# Patient Record
Sex: Female | Born: 1970 | Race: Black or African American | Hispanic: No | Smoking: Never smoker
Health system: Southern US, Community
[De-identification: ages and names within clinical notes are randomized; demographics above are authoritative.]

## PROBLEM LIST (undated history)

## (undated) DIAGNOSIS — Z5189 Encounter for other specified aftercare: Secondary | ICD-10-CM

## (undated) DIAGNOSIS — M1990 Unspecified osteoarthritis, unspecified site: Secondary | ICD-10-CM

## (undated) DIAGNOSIS — K219 Gastro-esophageal reflux disease without esophagitis: Secondary | ICD-10-CM

## (undated) DIAGNOSIS — D709 Neutropenia, unspecified: Secondary | ICD-10-CM

## (undated) DIAGNOSIS — A6000 Herpesviral infection of urogenital system, unspecified: Secondary | ICD-10-CM

## (undated) DIAGNOSIS — Z86718 Personal history of other venous thrombosis and embolism: Secondary | ICD-10-CM

## (undated) DIAGNOSIS — I10 Essential (primary) hypertension: Secondary | ICD-10-CM

## (undated) DIAGNOSIS — M797 Fibromyalgia: Secondary | ICD-10-CM

## (undated) HISTORY — PX: ABDOMINAL HYSTERECTOMY: SHX81

## (undated) HISTORY — PX: TONSILLECTOMY: SUR1361

## (undated) HISTORY — PX: TUBAL LIGATION: SHX77

---

## 2009-12-09 DIAGNOSIS — Z5189 Encounter for other specified aftercare: Secondary | ICD-10-CM

## 2009-12-09 DIAGNOSIS — IMO0001 Reserved for inherently not codable concepts without codable children: Secondary | ICD-10-CM

## 2009-12-09 HISTORY — DX: Encounter for other specified aftercare: Z51.89

## 2009-12-09 HISTORY — DX: Reserved for inherently not codable concepts without codable children: IMO0001

## 2009-12-09 HISTORY — PX: JOINT REPLACEMENT: SHX530

## 2010-04-03 ENCOUNTER — Inpatient Hospital Stay (HOSPITAL_COMMUNITY): Admission: RE | Admit: 2010-04-03 | Discharge: 2010-04-09 | Payer: Self-pay | Admitting: Orthopaedic Surgery

## 2010-04-05 ENCOUNTER — Encounter (INDEPENDENT_AMBULATORY_CARE_PROVIDER_SITE_OTHER): Payer: Self-pay | Admitting: Orthopaedic Surgery

## 2010-04-05 ENCOUNTER — Ambulatory Visit: Payer: Self-pay | Admitting: Vascular Surgery

## 2010-04-09 ENCOUNTER — Encounter (INDEPENDENT_AMBULATORY_CARE_PROVIDER_SITE_OTHER): Payer: Self-pay | Admitting: Orthopaedic Surgery

## 2010-05-16 ENCOUNTER — Ambulatory Visit: Payer: Self-pay | Admitting: Diagnostic Radiology

## 2010-05-16 ENCOUNTER — Ambulatory Visit (HOSPITAL_BASED_OUTPATIENT_CLINIC_OR_DEPARTMENT_OTHER): Admission: RE | Admit: 2010-05-16 | Discharge: 2010-05-16 | Payer: Self-pay | Admitting: Orthopaedic Surgery

## 2010-11-24 ENCOUNTER — Inpatient Hospital Stay (HOSPITAL_COMMUNITY)
Admission: EM | Admit: 2010-11-24 | Discharge: 2010-11-27 | Payer: Self-pay | Source: Home / Self Care | Attending: Internal Medicine | Admitting: Internal Medicine

## 2010-12-04 ENCOUNTER — Ambulatory Visit (HOSPITAL_COMMUNITY)
Admission: RE | Admit: 2010-12-04 | Discharge: 2010-12-04 | Payer: Self-pay | Source: Home / Self Care | Attending: Rheumatology | Admitting: Rheumatology

## 2010-12-09 HISTORY — PX: GASTRIC BYPASS: SHX52

## 2011-02-18 LAB — BASIC METABOLIC PANEL
BUN: 14 mg/dL (ref 6–23)
CO2: 26 mEq/L (ref 19–32)
Calcium: 9 mg/dL (ref 8.4–10.5)
Chloride: 107 mEq/L (ref 96–112)
Creatinine, Ser: 0.86 mg/dL (ref 0.4–1.2)
GFR calc Af Amer: >60 mL/min (ref >60)
GFR calc non Af Amer: >60 mL/min (ref >60)
Glucose, Bld: 95 mg/dL (ref 70–99)
Potassium: 3.2 mEq/L — ABNORMAL LOW (ref 3.5–5.1)
Sodium: 141 mEq/L (ref 135–145)

## 2011-02-18 LAB — CBC
HCT: 33 % — ABNORMAL LOW (ref 36.0–46.0)
HCT: 33.1 % — ABNORMAL LOW (ref 36.0–46.0)
HCT: 34.8 % — ABNORMAL LOW (ref 36.0–46.0)
Hemoglobin: 10.7 g/dL — ABNORMAL LOW (ref 12.0–15.0)
Hemoglobin: 10.9 g/dL — ABNORMAL LOW (ref 12.0–15.0)
Hemoglobin: 11.2 g/dL — ABNORMAL LOW (ref 12.0–15.0)
MCH: 27.2 pg (ref 26.0–34.0)
MCHC: 32.4 g/dL (ref 30.0–36.0)
MCV: 83.3 fL (ref 78.0–100.0)
MCV: 84 fL (ref 78.0–100.0)
Platelets: 237 10*3/uL (ref 150–400)
Platelets: 243 10*3/uL (ref 150–400)
RBC: 3.93 MIL/uL (ref 3.87–5.11)
RBC: 4 MIL/uL (ref 3.87–5.11)
RBC: 4.18 MIL/uL (ref 3.87–5.11)
RDW: 13.1 % (ref 11.5–15.5)
WBC: 3.6 10*3/uL — ABNORMAL LOW (ref 4.0–10.5)
WBC: 3.8 10*3/uL — ABNORMAL LOW (ref 4.0–10.5)
WBC: 4.4 10*3/uL (ref 4.0–10.5)

## 2011-02-18 LAB — COMPREHENSIVE METABOLIC PANEL
ALT: 38 U/L — ABNORMAL HIGH (ref 0–35)
ALT: 40 U/L — ABNORMAL HIGH (ref 0–35)
AST: 123 U/L — ABNORMAL HIGH (ref 0–37)
AST: 54 U/L — ABNORMAL HIGH (ref 0–37)
Albumin: 3.2 g/dL — ABNORMAL LOW (ref 3.5–5.2)
Alkaline Phosphatase: 64 U/L (ref 39–117)
BUN: 8 mg/dL (ref 6–23)
CO2: 28 mEq/L (ref 19–32)
Calcium: 8 mg/dL — ABNORMAL LOW (ref 8.4–10.5)
Chloride: 105 mEq/L (ref 96–112)
Chloride: 105 mEq/L (ref 96–112)
Chloride: 109 mEq/L (ref 96–112)
GFR calc Af Amer: >60 mL/min (ref >60)
GFR calc non Af Amer: >60 mL/min (ref >60)
Glucose, Bld: 102 mg/dL — ABNORMAL HIGH (ref 70–99)
Potassium: 3.7 mEq/L (ref 3.5–5.1)
Potassium: 3.9 mEq/L (ref 3.5–5.1)
Sodium: 141 mEq/L (ref 135–145)
Total Bilirubin: 0.3 mg/dL (ref 0.3–1.2)
Total Bilirubin: 0.5 mg/dL (ref 0.3–1.2)
Total Protein: 5.5 g/dL — ABNORMAL LOW (ref 6.0–8.3)

## 2011-02-18 LAB — URINALYSIS, ROUTINE W REFLEX MICROSCOPIC
Bilirubin Urine: NEGATIVE
Glucose, UA: NEGATIVE mg/dL
Ketones, ur: NEGATIVE mg/dL
Leukocytes, UA: NEGATIVE
Nitrite: NEGATIVE
Protein, ur: NEGATIVE mg/dL
Specific Gravity, Urine: 1.029 (ref 1.005–1.030)
Urobilinogen, UA: 1 mg/dL (ref 0.0–1.0)
pH: 6.5 (ref 5.0–8.0)

## 2011-02-18 LAB — RAPID URINE DRUG SCREEN, HOSP PERFORMED
Amphetamines: NOT DETECTED
Barbiturates: NOT DETECTED
Benzodiazepines: NOT DETECTED
Cocaine: NOT DETECTED
Opiates: NOT DETECTED
Tetrahydrocannabinol: NOT DETECTED

## 2011-02-18 LAB — DIFFERENTIAL
Basophils Absolute: 0 10*3/uL (ref 0.0–0.1)
Basophils Absolute: 0 10*3/uL (ref 0.0–0.1)
Basophils Relative: 1 % (ref 0–1)
Eosinophils Absolute: 0.1 10*3/uL (ref 0.0–0.7)
Eosinophils Absolute: 0.2 10*3/uL (ref 0.0–0.7)
Eosinophils Relative: 2 % (ref 0–5)
Eosinophils Relative: 3 % (ref 0–5)
Eosinophils Relative: 4 % (ref 0–5)
Lymphs Abs: 2 10*3/uL (ref 0.7–4.0)
Lymphs Abs: 2.3 10*3/uL (ref 0.7–4.0)
Monocytes Absolute: 0.2 10*3/uL (ref 0.1–1.0)
Monocytes Absolute: 0.4 10*3/uL (ref 0.1–1.0)
Monocytes Relative: 7 % (ref 3–12)
Neutro Abs: 1.1 10*3/uL — ABNORMAL LOW (ref 1.7–7.7)

## 2011-02-18 LAB — URIC ACID: Uric Acid, Serum: 4.4 mg/dL (ref 2.4–7.0)

## 2011-02-18 LAB — CARNITINE / ACYLCARNITINE PROFILE, BLD
Carnitine, Ester: 9 umol/L (ref 3.8–19.0)
Carnitine, Free: 34 umol/L (ref 25.0–55.0)
Carnitine, Total: 43 umol/L (ref 31.0–67.0)

## 2011-02-18 LAB — MAGNESIUM: Magnesium: 2.2 mg/dL (ref 1.5–2.5)

## 2011-02-18 LAB — PHOSPHORUS: Phosphorus: 3.4 mg/dL (ref 2.3–4.6)

## 2011-02-18 LAB — ALDOLASE: Aldolase: 54.8 U/L — ABNORMAL HIGH (ref <=8.1)

## 2011-02-18 LAB — MISCELLANEOUS TEST

## 2011-02-18 LAB — LACTATE DEHYDROGENASE: LDH: 502 U/L — ABNORMAL HIGH (ref 94–250)

## 2011-02-18 LAB — EXTRACTABLE NUCLEAR ANTIGEN ANTIBODY
ENA SM Ab Ser-aCnc: 1 AU/mL (ref <30)
SSA (Ro) (ENA) Antibody, IgG: 3 AU/mL (ref <30)
SSB (La) (ENA) Antibody, IgG: <1 AU/mL (ref <30)
Scleroderma (Scl-70) (ENA) Antibody, IgG: 2 AU/mL (ref <30)
Sm/rnp: 1 AU/mL (ref <30)
ds DNA Ab: 3 IU/mL (ref <30)

## 2011-02-18 LAB — ANA: Anti Nuclear Antibody(ANA): NEGATIVE

## 2011-02-18 LAB — URINE MICROSCOPIC-ADD ON

## 2011-02-18 LAB — TSH: TSH: 1.966 u[IU]/mL (ref 0.350–4.500)

## 2011-02-18 LAB — HIV ANTIBODY (ROUTINE TESTING W REFLEX): HIV: NONREACTIVE

## 2011-02-18 LAB — PROTIME-INR: Prothrombin Time: 12.2 seconds (ref 11.6–15.2)

## 2011-02-18 LAB — C-REACTIVE PROTEIN: CRP: 0.1 mg/dL — ABNORMAL LOW (ref <0.6)

## 2011-02-18 LAB — CARDIAC PANEL(CRET KIN+CKTOT+MB+TROPI): Total CK: 5939 U/L — ABNORMAL HIGH (ref 7–177)

## 2011-02-18 LAB — HAPTOGLOBIN: Haptoglobin: 61 mg/dL (ref 16–200)

## 2011-02-18 LAB — LIPID PANEL
Total CHOL/HDL Ratio: 6.4 RATIO
VLDL: 43 mg/dL — ABNORMAL HIGH (ref 0–40)

## 2011-02-18 LAB — AMMONIA
Ammonia: 29 umol/L (ref 11–35)
Ammonia: 73 umol/L — ABNORMAL HIGH (ref 11–35)

## 2011-02-18 LAB — CK
Total CK: 11782 U/L — ABNORMAL HIGH (ref 7–177)
Total CK: 15893 U/L — ABNORMAL HIGH (ref 7–177)

## 2011-02-18 LAB — JO-1 ANTIBODY-IGG: Jo-1 Antibody, IgG: 1 AU/mL (ref <30)

## 2011-02-18 LAB — HEPATITIS PANEL, ACUTE: Hep B C IgM: NEGATIVE

## 2011-02-18 LAB — LACTIC ACID, PLASMA: Lactic Acid, Venous: 1.2 mmol/L (ref 0.5–2.2)

## 2011-02-18 NOTE — Consult Note (Signed)
[name], [name]     ACCOUNT NO.:  [id number]  MEDICAL RECORD NO.:  [id number]          PATIENT TYPE:  INP  LOCATION:  4733                         FACILITY:  [facility]  PHYSICIAN:  Kathryne Hitch, MD   DATE OF BIRTH:  1971-03-06  DATE OF CONSULTATION:  11/26/2010 DATE OF DISCHARGE:                                CONSULTATION   REASON FOR CONSULTATION:  Elevated CK.  Alexandria [name] is a 40 year old female who was seen in consultation at the [facility] for evaluation of elevated CK.  I have known [name] from her visit to my office 2 days prior to her admission.  She was seen in consultation for request of Dr. Cleophas Dunker at that time.  She had a history of low back pain for 4-5 years for which she had been seeing a [facility].  She states Dr. Cleophas Dunker had MRI of the lumbar spine and diagnosis of her degenerative disk disease and referred her for epidural injections and she had some improvement after that.  She has also had problems with bilateral knee joint pain for several years and she was diagnosed with osteoarthritis of her knee joints for which she underwent right total knee replacement in April 2011, and recovered well.  Although, the pain in her bilateral knee joints persist to some extent, she also developed DVT in the postop period and was treated with Coumadin for sometime.  On the day of evaluation on November 23, 2010, we ordered some lab work and when the lab results came back, I was notified from the __________ lab that her CK was 7740.  At that time, [name] was shopping and had no complaints.  I advised her to go to emergency room for evaluation.  She states that she has been feeling somewhat tired, but not much.  She did have a very aggressive workout about 60 minutes 5 days a week.  She has done lots of abdominal crunches and has some abdominal discomfort from doing crunches.  Her muscle feel firm but still somewhat  sore but not weak.  She denies any muscular weakness.  She has no difficulty getting up from chair or sitting position.  She does have some discomfort in her knee joints which limits her to some extent.  She denies any history of hematuria.  She states she has taken medications for depression in the past, but not recently.  PAST MEDICAL HISTORY:  Remarkable for osteoarthritis of her knee joints, depression, asthma, right total knee replacement, hysterectomy, gastroesophageal reflux.  FAMILY HISTORY:  Positive for osteoarthritis, hypertension, fibromyalgia, diabetes, and asthma.  Prior to her admission, she was taking the medications which she does not recall exact name.  She believes she is on triamterene, hydrochlorothiazide, some other blood pressure medication, Nexium, and muscle relaxer.  MEDICATION ALLERGIES:  She states she cannot tolerate HYDROCODONE very well.  SOCIAL HISTORY:  She is a nonsmoker, she does not drink alcohol, occasional coffee, exercises quite aggressively.  She is gravida 3, para 2, miscarriages 1.  Pap smear 2010 was negative.  REVIEW OF SYSTEMS:  Positive for renal phenomenon, fatigue, nausea, reflux, constipation, muscle spasm, depression, and anemia.  PHYSICAL EXAMINATION:  GENERAL:  The patient was in no distress in the hospital room, well developed, well nourished. VITAL SIGNS:  Temperature 97.8, oxygen saturation 100% on room air, blood pressure 109/70, heart rate 68, respiratory rate 20. HEENT:  There was no evidence of conjunctival injection, oral ulcers, nasal ulcers, or lymphadenopathy. LUNGS:  Clear to auscultation without any added sounds. CARDIOVASCULAR SYSTEM:  Regular rate and rhythm without murmur, gallop, or rub.  She had good 4+ pulses. ABDOMEN:  Soft.  She has some muscular tenderness in the rectus abdominis muscle from crunches.  No tenderness, no mass. SKIN:  Without any rash.  She does have a surgical scar on her right knee from  the total knee replacement. MUSCULOSKELETAL:  She has some discomfort with range of motion of bilateral knee joints, warmth over her right knee joint, but no effusion was noted.  Left knee joint has some crepitus, all other joints with full range of motion.  She did have some discomfort with range of motion of her lumbar spine secondary to degenerative disk disease.  She has some prominence of the DIP and PIP joints in her feet. NEUROLOGIC:  Nonfocal.  She had good 5+ strength in all her extremities. Her DTRs were intact.  She had mild hyperalgesia and fibromyalgia tender points, only 8/15 positive.  INVESTIGATIONS:  We did some lab work through our office on November 23, 2010, which showed LFTs being elevated with AST of 132 and ALT of 36. WBC count was 3.9, which according to the patient has been baseline for her.  Her lupus anticoagulant is level.  TSH, rheumatoid factor, ANA were negative.  Serum protein electrophoresis was pending at this time. UA was negative.  Beta-2 anticardiolipin antibody and anti-CCP antibodies are pending at this point.  Vitamin D was low at 19.  Her CK came back high at 17740.  She also had some lab work in the hospital which showed ammonia level of 53, uric acid 4.4, magnesium was normal, LDH was elevated at 502.  Urine drug screen was negative.  Comprehensive metabolic panel showed elevation of AST and ALT at 87 and 41 respectively.  Her albumin was low at 3.2.  C. diff showed white cell count of 3.6, hemoglobin of 11.2.  PT was normal.  TSH was normal and CK in the hospital was 04540, lactic acid was normal.  The labs which are pending at this time include myoglobin, hepatitis panel, ENA, aldolase, carnitine level, and myositis panel.  IMPRESSION AND RECOMMENDATION:  Elevated CK, she is very active at the gym and does aggressive workout.  She has no muscular weakness on examination.  All her autoimmune workup has been negative so far.  All the myositis  panel is still pending.  She does have vitamin D deficiency, but it would not explain the elevated CK.  At this time, the differential include rhabdomyolysis, myopathy, or myositis.  We could not do EMG and nerve conduction velocities in the hospital and decided to proceed with the MRI of her thigh to rule out myositis which will be scheduled.  I also have suggested getting sedimentation rate, C-reactive protein, and  myositis panel to complete the workup.  She also has hepatitis panel pending at this point.  I believe her elevated LFTs are secondary to muscle sores as her CK is elevated.  Her TSH is normal.  If the MRI is negative, I would recommend going with the muscle biopsy and possible evaluation by neurologist for looking for any underlying pathology for myopathy.  She does have osteoarthritis of her knee and had right total knee replacement and have some discomfort in her left knee joint.  She also has degenerative disk disease of lumbar spine for which she has had MRI and epidural injections which has been quite stable.  At this point, I would wait for the results of MRI before make any further decisions.  We will follow her as needed.          ______________________________ Kathryne Hitch, MD     SD/MEDQ  D:  11/26/2010  T:  11/27/2010  Job:  [id number]  Electronically Signed by Pollyann Savoy MD on 02/18/2011 10:34:17 AM

## 2011-02-26 LAB — BASIC METABOLIC PANEL
BUN: 7 mg/dL (ref 6–23)
BUN: 4 mg/dL — ABNORMAL LOW (ref 6–23)
CO2: 28 mEq/L (ref 19–32)
CO2: 29 mEq/L (ref 19–32)
CO2: 29 mEq/L (ref 19–32)
CO2: 30 mEq/L (ref 19–32)
Calcium: 8.1 mg/dL — ABNORMAL LOW (ref 8.4–10.5)
Calcium: 8.4 mg/dL (ref 8.4–10.5)
Chloride: 98 mEq/L (ref 96–112)
Creatinine, Ser: 0.84 mg/dL (ref 0.4–1.2)
GFR calc Af Amer: >60 mL/min (ref >60)
GFR calc Af Amer: >60 mL/min (ref >60)
GFR calc Af Amer: >60 mL/min (ref >60)
GFR calc non Af Amer: >60 mL/min (ref >60)
GFR calc non Af Amer: >60 mL/min (ref >60)
GFR calc non Af Amer: >60 mL/min (ref >60)
GFR calc non Af Amer: >60 mL/min (ref >60)
Glucose, Bld: 106 mg/dL — ABNORMAL HIGH (ref 70–99)
Glucose, Bld: 108 mg/dL — ABNORMAL HIGH (ref 70–99)
Glucose, Bld: 112 mg/dL — ABNORMAL HIGH (ref 70–99)
Glucose, Bld: 112 mg/dL — ABNORMAL HIGH (ref 70–99)
Glucose, Bld: 99 mg/dL (ref 70–99)
Potassium: 3.6 mEq/L (ref 3.5–5.1)
Potassium: 3.9 mEq/L (ref 3.5–5.1)
Potassium: 3.1 mEq/L — ABNORMAL LOW (ref 3.5–5.1)
Potassium: 3.2 mEq/L — ABNORMAL LOW (ref 3.5–5.1)
Potassium: 3.8 mEq/L (ref 3.5–5.1)
Sodium: 135 mEq/L (ref 135–145)
Sodium: 131 mEq/L — ABNORMAL LOW (ref 135–145)
Sodium: 133 mEq/L — ABNORMAL LOW (ref 135–145)
Sodium: 133 mEq/L — ABNORMAL LOW (ref 135–145)

## 2011-02-26 LAB — PROTIME-INR
INR: 1.44 (ref 0.00–1.49)
INR: 1.51 — ABNORMAL HIGH (ref 0.00–1.49)
Prothrombin Time: 13.2 seconds (ref 11.6–15.2)
Prothrombin Time: 17.4 seconds — ABNORMAL HIGH (ref 11.6–15.2)

## 2011-02-26 LAB — ABO/RH: ABO/RH(D): O POS

## 2011-02-26 LAB — IRON AND TIBC
Iron: 27 ug/dL — ABNORMAL LOW (ref 42–135)
TIBC: 198 ug/dL — ABNORMAL LOW (ref 250–470)
UIBC: 171 ug/dL

## 2011-02-26 LAB — URINALYSIS, ROUTINE W REFLEX MICROSCOPIC
Bilirubin Urine: NEGATIVE
Ketones, ur: NEGATIVE mg/dL
Ketones, ur: NEGATIVE mg/dL
Leukocytes, UA: NEGATIVE
Nitrite: NEGATIVE
Nitrite: NEGATIVE
Protein, ur: NEGATIVE mg/dL
Specific Gravity, Urine: 1.031 — ABNORMAL HIGH (ref 1.005–1.030)
Urobilinogen, UA: 1 mg/dL (ref 0.0–1.0)
pH: 6 (ref 5.0–8.0)
pH: 6.5 (ref 5.0–8.0)
pH: 7 (ref 5.0–8.0)

## 2011-02-26 LAB — URINE CULTURE
Colony Count: NO GROWTH
Culture: NO GROWTH
Culture: NO GROWTH

## 2011-02-26 LAB — OSMOLALITY: Osmolality: 266 mOsm/kg — ABNORMAL LOW (ref 275–300)

## 2011-02-26 LAB — CBC
HCT: 25.3 % — ABNORMAL LOW (ref 36.0–46.0)
HCT: 27.7 % — ABNORMAL LOW (ref 36.0–46.0)
HCT: 24.8 % — ABNORMAL LOW (ref 36.0–46.0)
HCT: 29.4 % — ABNORMAL LOW (ref 36.0–46.0)
HCT: 36.7 % (ref 36.0–46.0)
Hemoglobin: 9.7 g/dL — ABNORMAL LOW (ref 12.0–15.0)
Hemoglobin: 8.4 g/dL — ABNORMAL LOW (ref 12.0–15.0)
Hemoglobin: 9.5 g/dL — ABNORMAL LOW (ref 12.0–15.0)
MCHC: 34 g/dL (ref 30.0–36.0)
MCHC: 35.2 g/dL (ref 30.0–36.0)
MCHC: 34.8 g/dL (ref 30.0–36.0)
MCHC: 34.8 g/dL (ref 30.0–36.0)
MCV: 83.6 fL (ref 78.0–100.0)
MCV: 82.6 fL (ref 78.0–100.0)
MCV: 82.7 fL (ref 78.0–100.0)
Platelets: 264 10*3/uL (ref 150–400)
Platelets: 203 10*3/uL (ref 150–400)
Platelets: 258 10*3/uL (ref 150–400)
RBC: 2.99 MIL/uL — ABNORMAL LOW (ref 3.87–5.11)
RBC: 3.56 MIL/uL — ABNORMAL LOW (ref 3.87–5.11)
RDW: 14.6 % (ref 11.5–15.5)
RDW: 14.7 % (ref 11.5–15.5)
RDW: 13.6 % (ref 11.5–15.5)
RDW: 13.8 % (ref 11.5–15.5)
RDW: 14.1 % (ref 11.5–15.5)
WBC: 5.7 10*3/uL (ref 4.0–10.5)

## 2011-02-26 LAB — COMPREHENSIVE METABOLIC PANEL
Albumin: 4 g/dL (ref 3.5–5.2)
BUN: 11 mg/dL (ref 6–23)
Chloride: 98 mEq/L (ref 96–112)
Creatinine, Ser: 0.86 mg/dL (ref 0.4–1.2)
Total Bilirubin: 0.2 mg/dL — ABNORMAL LOW (ref 0.3–1.2)
Total Protein: 6.7 g/dL (ref 6.0–8.3)

## 2011-02-26 LAB — DIFFERENTIAL
Basophils Absolute: 0 10*3/uL (ref 0.0–0.1)
Lymphocytes Relative: 47 % — ABNORMAL HIGH (ref 12–46)
Monocytes Absolute: 0.4 10*3/uL (ref 0.1–1.0)
Neutro Abs: 2.1 10*3/uL (ref 1.7–7.7)

## 2011-02-26 LAB — CROSSMATCH: ABO/RH(D): O POS

## 2011-02-26 LAB — TYPE AND SCREEN
ABO/RH(D): O POS
Antibody Screen: NEGATIVE

## 2011-02-26 LAB — URINE MICROSCOPIC-ADD ON

## 2011-02-26 LAB — APTT: aPTT: 27 seconds (ref 24–37)

## 2011-02-26 LAB — VITAMIN B12: Vitamin B-12: 1039 pg/mL — ABNORMAL HIGH (ref 211–911)

## 2012-03-23 ENCOUNTER — Encounter (HOSPITAL_COMMUNITY): Payer: Self-pay | Admitting: Pharmacy Technician

## 2012-03-30 ENCOUNTER — Encounter (HOSPITAL_COMMUNITY)
Admission: RE | Admit: 2012-03-30 | Discharge: 2012-03-30 | Disposition: A | Discharge status: Home / Self Care | Payer: BC Managed Care – PPO | Source: Ambulatory Visit | Attending: Orthopedic Surgery | Admitting: Orthopedic Surgery

## 2012-03-30 ENCOUNTER — Encounter (HOSPITAL_COMMUNITY): Payer: Self-pay

## 2012-03-30 ENCOUNTER — Encounter (HOSPITAL_COMMUNITY)
Admission: RE | Admit: 2012-03-30 | Discharge: 2012-03-30 | Disposition: A | Discharge status: Home / Self Care | Payer: BC Managed Care – PPO | Source: Ambulatory Visit | Attending: Orthopaedic Surgery | Admitting: Orthopaedic Surgery

## 2012-03-30 HISTORY — DX: Unspecified osteoarthritis, unspecified site: M19.90

## 2012-03-30 HISTORY — DX: Personal history of other venous thrombosis and embolism: Z86.718

## 2012-03-30 HISTORY — DX: Fibromyalgia: M79.7

## 2012-03-30 HISTORY — DX: Gastro-esophageal reflux disease without esophagitis: K21.9

## 2012-03-30 HISTORY — DX: Encounter for other specified aftercare: Z51.89

## 2012-03-30 HISTORY — DX: Essential (primary) hypertension: I10

## 2012-03-30 LAB — URINALYSIS, ROUTINE W REFLEX MICROSCOPIC
Nitrite: NEGATIVE
Protein, ur: NEGATIVE mg/dL
Specific Gravity, Urine: 1.02 (ref 1.005–1.030)
Urobilinogen, UA: 0.2 mg/dL (ref 0.0–1.0)

## 2012-03-30 LAB — DIFFERENTIAL
Eosinophils Relative: 3 % (ref 0–5)
Lymphocytes Relative: 56 % — ABNORMAL HIGH (ref 12–46)
Lymphs Abs: 2 10*3/uL (ref 0.7–4.0)
Monocytes Absolute: 0.4 10*3/uL (ref 0.1–1.0)
Monocytes Relative: 10 % (ref 3–12)

## 2012-03-30 LAB — SURGICAL PCR SCREEN
MRSA, PCR: NEGATIVE
Staphylococcus aureus: NEGATIVE

## 2012-03-30 LAB — CBC
MCH: 26.5 pg (ref 26.0–34.0)
Platelets: 322 10*3/uL (ref 150–400)
RBC: 4.75 MIL/uL (ref 3.87–5.11)
RDW: 12.6 % (ref 11.5–15.5)
WBC: 3.6 10*3/uL — ABNORMAL LOW (ref 4.0–10.5)

## 2012-03-30 LAB — COMPREHENSIVE METABOLIC PANEL
ALT: 39 U/L — ABNORMAL HIGH (ref 0–35)
AST: 25 U/L (ref 0–37)
Albumin: 4.2 g/dL (ref 3.5–5.2)
CO2: 32 mEq/L (ref 19–32)
Calcium: 10.2 mg/dL (ref 8.4–10.5)
GFR calc non Af Amer: 85 mL/min — ABNORMAL LOW (ref >90)
Sodium: 138 mEq/L (ref 135–145)

## 2012-03-30 LAB — TYPE AND SCREEN
ABO/RH(D): O POS
Antibody Screen: NEGATIVE

## 2012-03-30 MED ORDER — CHLORHEXIDINE GLUCONATE 4 % EX LIQD: 60.0000 mL | Freq: Every day | CUTANEOUS | Status: DC

## 2012-03-30 NOTE — Pre-Procedure Instructions (Signed)
20 Alexandria [name]  03/30/2012   Your procedure is scheduled on:  04/07/2012 Tuesday .   Report to [facility] at 0800 AM.per Dr Cleophas Dunker.   Call this number if you have problems the morning of surgery: [phone]   Remember:   Do not eat food:After Midnight.  May have clear liquids: up to 4 Hours before arrival is  0400 am. Do not drink any liquids after 0400 am the day of surgery .    Clear liquids include soda, tea, black coffee, apple or grape juice, broth.  Take these medicines the morning of surgery with A SIP OF WATER: norvasc  nexium  tramadol   Do not wear jewelry, make-up or nail polish.  Do not wear lotions, powders, or perfumes. You may wear deodorant.  Do not shave 48 hours prior to surgery.  Do not bring valuables to the hospital.  Contacts, dentures or bridgework may not be worn into surgery.  Leave suitcase in the car. After surgery it may be brought to your room.  For patients admitted to the hospital, checkout time is 11:00 AM the day of discharge.   Patients discharged the day of surgery will not be allowed to drive home.  Name and phone number of your driver: mother Silvio Pate- [phone]  Special Instructions: CHG Shower Use Special Wash: 1/2 bottle night before surgery and 1/2 bottle morning of surgery.   Please read over the following fact sheets that you were given: Pain Booklet, Coughing and Deep Breathing, Blood Transfusion Information, MRSA Information and Surgical Site Infection Prevention

## 2012-03-31 NOTE — Consult Note (Addendum)
Anesthesia Chart Review:  Patient is a 41 year old female scheduled for a left TKR on 04/07/12.  History includes HTN, right TKA on 04/03/10 complicated by right LE DVT, hospitalization for rhabdomyolysis of unclear etiology in December 2011, GERD s/p Nissen fundoplication, asthma, fibromyalgia, arthritis, blood transfusion, obesity with BMI 35, history of gastric bypass and hysterectomy.  Her last PCP listed is Dr. Darnell Level in [facility].  CXR from 03/30/12 showed no acute process.  Labs noted.  AST 25, ALT minimally elevated at 39.  WBC 3.6.  Coags WNL.  EKG from 03/30/12 showed NSR, minimal voltage criteria for LVH, anterior infarct (age undetermined), anteroseptal T wave inversion (consider ischemia), more pronounced than from her last EKG on 11/24/10 and appear new in V4 and V5 since 03/29/10.  Prior to her right TKA she reported a history of a stress test around 2010 which was thought to be at [facility].  At that time (and I confirmed again today), [facility] did not have a record of a stress test, echo, or EKG.  Today I called Dr. Evangeline Gula office and they do not have one on file either--however, I was told only the last two years are currently kept on location and her other records are in storage and not readily available.  I was unable to reach the patient by telephone earlier today.    I reviewed above with Anesthesiologist Dr. Chaney Malling.  He recommends Cardiology evaluation pre-operatively unless we are able to find out that the patient has had a recent cardiac evaluation/diagnostic study that was WNL with similar EKG findings.  I updated Alexandria [name] at Dr. Hoy Register office.  She will review with Dr. Cleophas Dunker or his PA.  Addendum:  04/01/12 1600  I called and spoke with the patient.  She thought she may have had some cardiac testing done in the past, but if so it was at [facility] (which they have no record of).  She tolerated a gastric bypass procedure at [facility] in October 2012.  She denies  CP, SOB.  She is no very active due to knee pain, but says she could walk up to a mile if needed.  She gets occasional knee swelling, but otherwise no significant edema.  Dr. Hoy Register office did provide notes of medical clearance from her PCP.  They also touched base with the Bariatric surgery staff who felt there was no restrictions from their standpoint.  I reviewed the new information provided with Anesthesiologist Dr. Noreene Larsson.  He agrees that it would still be best for her to have a Cardiology evaluation pre-operatively due to her abnormal EKG.  I notified Alexandria [name] at Dr. Hoy Register office.  They have arranged for her to see Cardiologist Dr. Donnie Aho on Friday, 04/03/12.    Addendum:  04/03/12 1520  Patient was seen by Dr. Donnie Aho earlier today.  He felt she was acceptable to proceed with the planned procedure.  He did recommend post-op DVT prophylaxis with her history of post-operative DVT.  He felt her abnormal EKG was at baseline, and since she had tolerated recent surgery he only recommended an echocardiogram post-operatively ("when she is recovering").  Alexandria Chock, PA-C

## 2012-04-01 NOTE — H&P (Signed)
CHIEF COMPLAINT: Painful left knee.    HISTORY: Alexandria [name] is a very pleasant 41 year old African American female who is seen today for evaluation of her left knee. She is status post right total knee arthroplasty with good results. However, her left knee is starting to become very symptomatic. She is now having this constant extremely severe aching pain with marked weakness secondary to the pain. It is getting worse and it really even wakes her at nighttime. Everything makes her symptoms worse. Ice and elevation have been helpful. She is using antispasmodics and Cymbalta and has had multiple injections, bracing, and physical therapy, but unfortunately she continues to have worsening pain to the point where she is having difficulty with doing her activities of daily living and actually just being able to ambulate at all. She is seen today for evaluation.   PAST MEDICAL HISTORY: Surgeries have included that of bariatric surgery in October of 2012. Right total knee arthroplasty in April 2011. In 2008 a total abdominal hysterectomy with bilateral salpingo-oophorectomy. In 2006 outpatient arthroscopy of both knees. She has had childbirth in November of 1993 and October of 1990. She has had also 3 nerve blocks as an outpatient.   CURRENT MEDICATIONS:  Tramadol p.r.n. Voltaren p.r.n. Cymbalta 60 mg daily Multivitamin daily Vitamin D daily Amlodipine daily Methocarbamol 500 p.r.n. Nexium 60 mg daily    allergies: None known.    REVIEW OF SYSTEMS: A 14 point review of systems is negative except for asthma and weight loss secondary to sleeve bypass. She has had a DVT in the past to my recollection.   FAMILY HISTORY: Family history reveals a mother who is still alive at age 86 who has hypertension. Father is alive at age 38 also. Brother is age 10. No sisters.   SOCIAL HISTORY: She is a 37 year old African American female. Divorced. Administrator. Denies use of tobacco or alcohol.   PHYSICAL EXAM:  Examination today reveals a very pleasant 41 year old [place] American female. Well-developed, well-nourished, alert, pleasant and cooperative in moderate distress secondary to left knee pain. She is 5 foot 6 inches and weighs 221 pounds. BMI is 35.7. Temperature 98.4, pulse 72, respirations 18, blood pressure 138/101.    Head is normocephalic. Eyes: pupils equal and round and react to light and accommodation. Extraocular motions are intact.  Ears, nose and throat were benign. Neck was supple; no bruits. Chest had good expansion. Lungs were clear to auscultation. Cardiac had a regular rhythm and rate, normal S1-S2. No murmurs noted.  Abdomen is obese, soft, nontender. No masses palpable. Normal bowel sounds present. Genital, rectal and breast exam not indicated for orthopedic evaluation. CNS oriented x3 and cranial nerves II through XII grossly intact. Her skin is intact. Musculoskeletal: today the left knee reveals range of motion from 2 degrees to 105 degrees. She does have a positive effusion. There is crepitance with range of motion of the left knee. Diffuse tenderness about the knee. Pseudo-laxity with varus and valgus stressing. Neurovascularly intact distally.   CLINICAL IMPRESSION:  1.   End-stage OA left knee. 2.   Status post right total knee arthroplasty.  3.   Obesity. BMI of 35.7.  4.   History of hypertension. 5.   History of asthma. 6.   History of GERD. 7.   History of DVT.    recommendations: At this time we feel that she is a candidate for a total knee replacement on the left side. Certainly with her history as well as her exam she has  failed numerous treatments. Radiographically she is noted to be bone-on-bone medial compartment OA with periarticular spurring more medially than lateral, and also has some patellofemoral arthritis noted.   The procedure risks and benefits have been explained to her again and she is understanding. She would like to proceed with this in the  near future. I have reviewed the clearance information from [facility] as well as bariatric surgery.   [name], PA-C 04/01/2012 4:50 PM

## 2012-04-06 MED ORDER — CEFAZOLIN SODIUM-DEXTROSE 2-3 GM-% IV SOLR
2.0000 g | INTRAVENOUS | Status: AC
  Administered 2012-04-07: 2 g via INTRAVENOUS
  Filled 2012-04-06: qty 50

## 2012-04-07 ENCOUNTER — Encounter (HOSPITAL_COMMUNITY): Payer: Self-pay | Admitting: Vascular Surgery

## 2012-04-07 ENCOUNTER — Ambulatory Visit (HOSPITAL_COMMUNITY): Payer: BC Managed Care – PPO | Admitting: Vascular Surgery

## 2012-04-07 ENCOUNTER — Inpatient Hospital Stay (HOSPITAL_COMMUNITY)
Admission: RE | Admit: 2012-04-07 | Discharge: 2012-04-10 | DRG: 209 | Disposition: A | Discharge status: Home Health | Payer: BC Managed Care – PPO | Source: Ambulatory Visit | Attending: Orthopaedic Surgery | Admitting: Orthopaedic Surgery

## 2012-04-07 ENCOUNTER — Encounter (HOSPITAL_COMMUNITY)
Admission: RE | Disposition: A | Discharge status: Home Health | Payer: Self-pay | Source: Ambulatory Visit | Attending: Orthopaedic Surgery

## 2012-04-07 DIAGNOSIS — Z9884 Bariatric surgery status: Secondary | ICD-10-CM

## 2012-04-07 DIAGNOSIS — R040 Epistaxis: Secondary | ICD-10-CM | POA: Diagnosis not present

## 2012-04-07 DIAGNOSIS — Z90710 Acquired absence of both cervix and uterus: Secondary | ICD-10-CM

## 2012-04-07 DIAGNOSIS — I10 Essential (primary) hypertension: Secondary | ICD-10-CM | POA: Diagnosis present

## 2012-04-07 DIAGNOSIS — Z6835 Body mass index (BMI) 35.0-35.9, adult: Secondary | ICD-10-CM

## 2012-04-07 DIAGNOSIS — E669 Obesity, unspecified: Secondary | ICD-10-CM | POA: Diagnosis present

## 2012-04-07 DIAGNOSIS — J45909 Unspecified asthma, uncomplicated: Secondary | ICD-10-CM | POA: Diagnosis present

## 2012-04-07 DIAGNOSIS — M179 Osteoarthritis of knee, unspecified: Secondary | ICD-10-CM | POA: Diagnosis present

## 2012-04-07 DIAGNOSIS — M1710 Unilateral primary osteoarthritis, unspecified knee: Principal | ICD-10-CM | POA: Diagnosis present

## 2012-04-07 DIAGNOSIS — Z96659 Presence of unspecified artificial knee joint: Secondary | ICD-10-CM

## 2012-04-07 DIAGNOSIS — Z86718 Personal history of other venous thrombosis and embolism: Secondary | ICD-10-CM

## 2012-04-07 DIAGNOSIS — IMO0001 Reserved for inherently not codable concepts without codable children: Secondary | ICD-10-CM | POA: Diagnosis present

## 2012-04-07 DIAGNOSIS — K219 Gastro-esophageal reflux disease without esophagitis: Secondary | ICD-10-CM | POA: Diagnosis present

## 2012-04-07 DIAGNOSIS — D62 Acute posthemorrhagic anemia: Secondary | ICD-10-CM | POA: Diagnosis not present

## 2012-04-07 HISTORY — PX: TOTAL KNEE ARTHROPLASTY: SHX125

## 2012-04-07 SURGERY — ARTHROPLASTY, KNEE, TOTAL
Anesthesia: Regional | Site: Knee | Laterality: Left | Wound class: Clean

## 2012-04-07 MED ORDER — METOCLOPRAMIDE HCL 5 MG/ML IJ SOLN: 5.0000 mg | Freq: Three times a day (TID) | INTRAMUSCULAR | Status: DC | PRN

## 2012-04-07 MED ORDER — LACTATED RINGERS IV SOLN
INTRAVENOUS | Status: DC
  Administered 2012-04-07: 09:00:00 via INTRAVENOUS

## 2012-04-07 MED ORDER — FENTANYL CITRATE 0.05 MG/ML IJ SOLN
INTRAMUSCULAR | Status: DC | PRN
  Administered 2012-04-07: 50 ug via INTRAVENOUS
  Administered 2012-04-07 (×3): 25 ug via INTRAVENOUS
  Administered 2012-04-07: 150 ug via INTRAVENOUS
  Administered 2012-04-07: 50 ug via INTRAVENOUS
  Administered 2012-04-07 (×3): 25 ug via INTRAVENOUS
  Administered 2012-04-07: 50 ug via INTRAVENOUS
  Administered 2012-04-07 (×2): 25 ug via INTRAVENOUS

## 2012-04-07 MED ORDER — ACETAMINOPHEN 10 MG/ML IV SOLN
1000.0000 mg | Freq: Once | INTRAVENOUS | Status: AC
  Administered 2012-04-07: 1000 mg via INTRAVENOUS
  Filled 2012-04-07: qty 100

## 2012-04-07 MED ORDER — LACTATED RINGERS IV SOLN: INTRAVENOUS | Status: DC

## 2012-04-07 MED ORDER — LIDOCAINE HCL (CARDIAC) 20 MG/ML IV SOLN
INTRAVENOUS | Status: DC | PRN
  Administered 2012-04-07: 20 mg via INTRAVENOUS

## 2012-04-07 MED ORDER — HYDROMORPHONE HCL PF 1 MG/ML IJ SOLN
0.2500 mg | INTRAMUSCULAR | Status: DC | PRN
  Administered 2012-04-07 (×3): 0.5 mg via INTRAVENOUS

## 2012-04-07 MED ORDER — MIDAZOLAM HCL 2 MG/2ML IJ SOLN: 1.0000 mg | INTRAMUSCULAR | Status: DC | PRN

## 2012-04-07 MED ORDER — DOCUSATE SODIUM 100 MG PO CAPS
100.0000 mg | ORAL_CAPSULE | Freq: Two times a day (BID) | ORAL | Status: DC
  Administered 2012-04-07 – 2012-04-10 (×6): 100 mg via ORAL
  Filled 2012-04-07 (×7): qty 1

## 2012-04-07 MED ORDER — OXYCODONE HCL 5 MG PO TABS
5.0000 mg | ORAL_TABLET | ORAL | Status: DC | PRN
  Administered 2012-04-07 – 2012-04-08 (×5): 10 mg via ORAL
  Administered 2012-04-08: 5 mg via ORAL
  Administered 2012-04-08 (×3): 10 mg via ORAL
  Administered 2012-04-08: 5 mg via ORAL
  Administered 2012-04-09 (×6): 10 mg via ORAL
  Administered 2012-04-10: 5 mg via ORAL
  Administered 2012-04-10: 10 mg via ORAL
  Filled 2012-04-07 (×16): qty 2

## 2012-04-07 MED ORDER — BUPIVACAINE-EPINEPHRINE 0.25% -1:200000 IJ SOLN
INTRAMUSCULAR | Status: DC | PRN
  Administered 2012-04-07: 30 mL

## 2012-04-07 MED ORDER — FENTANYL CITRATE 0.05 MG/ML IJ SOLN
INTRAMUSCULAR | Status: AC
  Filled 2012-04-07: qty 2

## 2012-04-07 MED ORDER — KETOROLAC TROMETHAMINE 30 MG/ML IJ SOLN
30.0000 mg | Freq: Four times a day (QID) | INTRAMUSCULAR | Status: DC
  Administered 2012-04-07: 30 mg via INTRAVENOUS

## 2012-04-07 MED ORDER — SODIUM CHLORIDE 0.9 % IR SOLN
Status: DC | PRN
  Administered 2012-04-07: 3000 mL
  Administered 2012-04-07: 1000 mL

## 2012-04-07 MED ORDER — CEFAZOLIN SODIUM-DEXTROSE 2-3 GM-% IV SOLR
2.0000 g | Freq: Four times a day (QID) | INTRAVENOUS | Status: AC
  Administered 2012-04-07 – 2012-04-08 (×3): 2 g via INTRAVENOUS
  Filled 2012-04-07 (×3): qty 50

## 2012-04-07 MED ORDER — ZOLPIDEM TARTRATE 5 MG PO TABS: 5.0000 mg | ORAL_TABLET | Freq: Every evening | ORAL | Status: DC | PRN

## 2012-04-07 MED ORDER — PANTOPRAZOLE SODIUM 40 MG PO TBEC
80.0000 mg | DELAYED_RELEASE_TABLET | Freq: Every day | ORAL | Status: DC
  Administered 2012-04-07 – 2012-04-09 (×3): 80 mg via ORAL
  Filled 2012-04-07 (×3): qty 2

## 2012-04-07 MED ORDER — AMLODIPINE BESYLATE 10 MG PO TABS
10.0000 mg | ORAL_TABLET | Freq: Every day | ORAL | Status: DC
  Administered 2012-04-07 – 2012-04-10 (×3): 10 mg via ORAL
  Filled 2012-04-07 (×4): qty 1

## 2012-04-07 MED ORDER — HYDROCHLOROTHIAZIDE 25 MG PO TABS
25.0000 mg | ORAL_TABLET | Freq: Every day | ORAL | Status: DC
  Administered 2012-04-09: 25 mg via ORAL
  Filled 2012-04-07 (×3): qty 1

## 2012-04-07 MED ORDER — MIDAZOLAM HCL 2 MG/2ML IJ SOLN
INTRAMUSCULAR | Status: AC
  Filled 2012-04-07: qty 2

## 2012-04-07 MED ORDER — SODIUM CHLORIDE 0.9 % IV SOLN: INTRAVENOUS | Status: DC

## 2012-04-07 MED ORDER — ONDANSETRON HCL 4 MG/2ML IJ SOLN
INTRAMUSCULAR | Status: DC | PRN
  Administered 2012-04-07: 4 mg via INTRAVENOUS

## 2012-04-07 MED ORDER — FENTANYL CITRATE 0.05 MG/ML IJ SOLN: 50.0000 ug | INTRAMUSCULAR | Status: DC | PRN

## 2012-04-07 MED ORDER — METOCLOPRAMIDE HCL 10 MG PO TABS: 5.0000 mg | ORAL_TABLET | Freq: Three times a day (TID) | ORAL | Status: DC | PRN

## 2012-04-07 MED ORDER — PHENOL 1.4 % MT LIQD: 1.0000 | OROMUCOSAL | Status: DC | PRN

## 2012-04-07 MED ORDER — DULOXETINE HCL 60 MG PO CPEP
60.0000 mg | ORAL_CAPSULE | Freq: Every day | ORAL | Status: DC
Start: 2012-04-07 — End: 2012-04-10
  Administered 2012-04-07 – 2012-04-10 (×4): 60 mg via ORAL
  Filled 2012-04-07 (×4): qty 1

## 2012-04-07 MED ORDER — LACTATED RINGERS IV SOLN
INTRAVENOUS | Status: DC | PRN
  Administered 2012-04-07 (×2): via INTRAVENOUS

## 2012-04-07 MED ORDER — METHOCARBAMOL 100 MG/ML IJ SOLN
500.0000 mg | INTRAVENOUS | Status: AC
  Administered 2012-04-07: 500 mg via INTRAVENOUS
  Filled 2012-04-07: qty 5

## 2012-04-07 MED ORDER — VECURONIUM BROMIDE 10 MG IV SOLR
INTRAVENOUS | Status: DC | PRN
  Administered 2012-04-07: 4 mg via INTRAVENOUS

## 2012-04-07 MED ORDER — ACETAMINOPHEN 10 MG/ML IV SOLN
1000.0000 mg | Freq: Four times a day (QID) | INTRAVENOUS | Status: AC
  Administered 2012-04-07 – 2012-04-08 (×3): 1000 mg via INTRAVENOUS
  Filled 2012-04-07 (×5): qty 100

## 2012-04-07 MED ORDER — GLYCOPYRROLATE 0.2 MG/ML IJ SOLN
INTRAMUSCULAR | Status: DC | PRN
  Administered 2012-04-07 (×2): .3 mg via INTRAVENOUS

## 2012-04-07 MED ORDER — RIVAROXABAN 10 MG PO TABS
10.0000 mg | ORAL_TABLET | Freq: Every day | ORAL | Status: DC
  Administered 2012-04-07 – 2012-04-08 (×2): 10 mg via ORAL
  Filled 2012-04-07 (×4): qty 1

## 2012-04-07 MED ORDER — METHOCARBAMOL 100 MG/ML IJ SOLN
500.0000 mg | Freq: Four times a day (QID) | INTRAVENOUS | Status: DC | PRN
  Filled 2012-04-07: qty 5

## 2012-04-07 MED ORDER — METHOCARBAMOL 500 MG PO TABS
500.0000 mg | ORAL_TABLET | Freq: Four times a day (QID) | ORAL | Status: DC | PRN
  Administered 2012-04-07 – 2012-04-10 (×10): 500 mg via ORAL
  Filled 2012-04-07 (×10): qty 1

## 2012-04-07 MED ORDER — CHLORHEXIDINE GLUCONATE 4 % EX LIQD: 60.0000 mL | Freq: Once | CUTANEOUS | Status: DC

## 2012-04-07 MED ORDER — ONDANSETRON HCL 4 MG/2ML IJ SOLN
4.0000 mg | Freq: Four times a day (QID) | INTRAMUSCULAR | Status: DC | PRN
  Administered 2012-04-07: 4 mg via INTRAVENOUS
  Filled 2012-04-07: qty 2

## 2012-04-07 MED ORDER — MENTHOL 3 MG MT LOZG: 1.0000 | LOZENGE | OROMUCOSAL | Status: DC | PRN

## 2012-04-07 MED ORDER — ONDANSETRON HCL 4 MG PO TABS: 4.0000 mg | ORAL_TABLET | Freq: Four times a day (QID) | ORAL | Status: DC | PRN

## 2012-04-07 MED ORDER — DEXAMETHASONE SODIUM PHOSPHATE 4 MG/ML IJ SOLN
INTRAMUSCULAR | Status: DC | PRN
  Administered 2012-04-07: 8 mg via INTRAVENOUS

## 2012-04-07 MED ORDER — ESTRADIOL 0.025 MG/24HR TD PTWK
0.0250 mg | MEDICATED_PATCH | TRANSDERMAL | Status: DC
  Filled 2012-04-07: qty 1

## 2012-04-07 MED ORDER — MIDAZOLAM HCL 5 MG/5ML IJ SOLN
INTRAMUSCULAR | Status: DC | PRN
  Administered 2012-04-07 (×2): 1 mg via INTRAVENOUS

## 2012-04-07 MED ORDER — SODIUM CHLORIDE 0.9 % IV SOLN
75.0000 mL/h | INTRAVENOUS | Status: DC
  Administered 2012-04-07: 75 mL/h via INTRAVENOUS

## 2012-04-07 MED ORDER — NEOSTIGMINE METHYLSULFATE 1 MG/ML IJ SOLN
INTRAMUSCULAR | Status: DC | PRN
  Administered 2012-04-07 (×2): 2 mg via INTRAVENOUS

## 2012-04-07 MED ORDER — ONDANSETRON HCL 4 MG/2ML IJ SOLN: 4.0000 mg | Freq: Once | INTRAMUSCULAR | Status: DC | PRN

## 2012-04-07 MED ORDER — HYDROMORPHONE HCL PF 1 MG/ML IJ SOLN
0.5000 mg | INTRAMUSCULAR | Status: DC | PRN
  Administered 2012-04-08 – 2012-04-09 (×9): 1 mg via INTRAVENOUS
  Filled 2012-04-07 (×9): qty 1

## 2012-04-07 MED ORDER — ROCURONIUM BROMIDE 100 MG/10ML IV SOLN
INTRAVENOUS | Status: DC | PRN
  Administered 2012-04-07: 50 mg via INTRAVENOUS

## 2012-04-07 MED ORDER — PROPOFOL 10 MG/ML IV EMUL
INTRAVENOUS | Status: DC | PRN
  Administered 2012-04-07: 150 mg via INTRAVENOUS

## 2012-04-07 SURGICAL SUPPLY — 63 items
BANDAGE ESMARK 6X9 LF (GAUZE/BANDAGES/DRESSINGS) ×1 IMPLANT
BLADE SAGITTAL 25.0X1.19X90 (BLADE) ×2 IMPLANT
BNDG ESMARK 6X9 LF (GAUZE/BANDAGES/DRESSINGS) ×2
BOWL SMART MIX CTS (DISPOSABLE) ×2 IMPLANT
CEMENT HV SMART SET (Cement) ×4 IMPLANT
CLOTH BEACON ORANGE TIMEOUT ST (SAFETY) ×2 IMPLANT
COVER BACK TABLE 24X17X13 BIG (DRAPES) IMPLANT
COVER SURGICAL LIGHT HANDLE (MISCELLANEOUS) ×2 IMPLANT
CUFF TOURNIQUET SINGLE 34IN LL (TOURNIQUET CUFF) IMPLANT
CUFF TOURNIQUET SINGLE 44IN (TOURNIQUET CUFF) ×2 IMPLANT
DRAPE EXTREMITY T 121X128X90 (DRAPE) ×2 IMPLANT
DRAPE PROXIMA HALF (DRAPES) ×2 IMPLANT
DRSG ADAPTIC 3X8 NADH LF (GAUZE/BANDAGES/DRESSINGS) ×2 IMPLANT
DRSG PAD ABDOMINAL 8X10 ST (GAUZE/BANDAGES/DRESSINGS) ×2 IMPLANT
DURAPREP 26ML APPLICATOR (WOUND CARE) ×2 IMPLANT
ELECT CAUTERY BLADE 6.4 (BLADE) ×2 IMPLANT
ELECT REM PT RETURN 9FT ADLT (ELECTROSURGICAL) ×2
ELECTRODE REM PT RTRN 9FT ADLT (ELECTROSURGICAL) ×1 IMPLANT
EVACUATOR 1/8 PVC DRAIN (DRAIN) ×2 IMPLANT
FACESHIELD LNG OPTICON STERILE (SAFETY) ×6 IMPLANT
FLOSEAL 10ML (HEMOSTASIS) IMPLANT
GLOVE BIO SURGEON STRL SZ 6.5 (GLOVE) ×2 IMPLANT
GLOVE BIOGEL PI IND STRL 7.0 (GLOVE) ×3 IMPLANT
GLOVE BIOGEL PI IND STRL 8 (GLOVE) ×1 IMPLANT
GLOVE BIOGEL PI IND STRL 8.5 (GLOVE) ×1 IMPLANT
GLOVE BIOGEL PI INDICATOR 7.0 (GLOVE) ×3
GLOVE BIOGEL PI INDICATOR 8 (GLOVE) ×1
GLOVE BIOGEL PI INDICATOR 8.5 (GLOVE) ×1
GLOVE ECLIPSE 8.0 STRL XLNG CF (GLOVE) ×4 IMPLANT
GLOVE SURG ORTHO 8.5 STRL (GLOVE) ×4 IMPLANT
GLOVE SURG SS PI 6.5 STRL IVOR (GLOVE) ×2 IMPLANT
GOWN PREVENTION PLUS XLARGE (GOWN DISPOSABLE) ×8 IMPLANT
GOWN STRL NON-REIN LRG LVL3 (GOWN DISPOSABLE) ×2 IMPLANT
GOWN STRL REIN 3XL LVL4 (GOWN DISPOSABLE) ×2 IMPLANT
HANDPIECE INTERPULSE COAX TIP (DISPOSABLE) ×1
KIT BASIN OR (CUSTOM PROCEDURE TRAY) ×2 IMPLANT
KIT ROOM TURNOVER OR (KITS) ×2 IMPLANT
MANIFOLD NEPTUNE II (INSTRUMENTS) ×2 IMPLANT
MARKER SPHERE PSV REFLC THRD 5 (MARKER) IMPLANT
NEEDLE 22X1 1/2 (OR ONLY) (NEEDLE) ×2 IMPLANT
NS IRRIG 1000ML POUR BTL (IV SOLUTION) ×2 IMPLANT
PACK TOTAL JOINT (CUSTOM PROCEDURE TRAY) ×2 IMPLANT
PAD ARMBOARD 7.5X6 YLW CONV (MISCELLANEOUS) ×4 IMPLANT
PAD CAST 4YDX4 CTTN HI CHSV (CAST SUPPLIES) ×1 IMPLANT
PADDING CAST COTTON 4X4 STRL (CAST SUPPLIES) ×1
PADDING CAST COTTON 6X4 STRL (CAST SUPPLIES) ×2 IMPLANT
PIN SCHANZ 4MM 130MM (PIN) IMPLANT
SET HNDPC FAN SPRY TIP SCT (DISPOSABLE) ×1 IMPLANT
SPONGE GAUZE 4X4 12PLY (GAUZE/BANDAGES/DRESSINGS) ×2 IMPLANT
STAPLER VISISTAT 35W (STAPLE) ×2 IMPLANT
SUCTION FRAZIER TIP 10 FR DISP (SUCTIONS) ×2 IMPLANT
SUT BONE WAX W31G (SUTURE) ×2 IMPLANT
SUT ETHIBOND NAB CT1 #1 30IN (SUTURE) ×6 IMPLANT
SUT MNCRL AB 3-0 PS2 18 (SUTURE) ×2 IMPLANT
SUT VIC AB 0 CT1 27 (SUTURE) ×1
SUT VIC AB 0 CT1 27XBRD ANBCTR (SUTURE) ×1 IMPLANT
SUT VIC AB 1 CT1 27 (SUTURE) ×2
SUT VIC AB 1 CT1 27XBRD ANBCTR (SUTURE) ×2 IMPLANT
SYR CONTROL 10ML LL (SYRINGE) ×2 IMPLANT
TOWEL OR 17X24 6PK STRL BLUE (TOWEL DISPOSABLE) ×2 IMPLANT
TOWEL OR 17X26 10 PK STRL BLUE (TOWEL DISPOSABLE) ×2 IMPLANT
TRAY FOLEY CATH 14FR (SET/KITS/TRAYS/PACK) ×2 IMPLANT
WATER STERILE IRR 1000ML POUR (IV SOLUTION) ×2 IMPLANT

## 2012-04-07 NOTE — Anesthesia Preprocedure Evaluation (Addendum)
Anesthesia Evaluation  Patient identified by MRN, date of birth, ID band Patient awake    Reviewed: Allergy & Precautions, H&P , NPO status , Patient's Chart, lab work & pertinent test results, reviewed documented beta blocker date and time   Airway Mallampati: I      Dental  (+) Teeth Intact and Dental Advisory Given   Pulmonary asthma ,  breath sounds clear to auscultation        Cardiovascular hypertension, Pt. on medications Rhythm:Regular Rate:Normal     Neuro/Psych  Neuromuscular disease    GI/Hepatic GERD-  Medicated,  Endo/Other    Renal/GU      Musculoskeletal  (+) Fibromyalgia -  Abdominal (+) + obese,   Peds  Hematology   Anesthesia Other Findings   Reproductive/Obstetrics                          Anesthesia Physical Anesthesia Plan  ASA: II  Anesthesia Plan: General and Regional   Post-op Pain Management:    Induction: Intravenous  Airway Management Planned: Oral ETT  Additional Equipment:   Intra-op Plan:   Post-operative Plan: Extubation in OR  Informed Consent: I have reviewed the patients History and Physical, chart, labs and discussed the procedure including the risks, benefits and alternatives for the proposed anesthesia with the patient or authorized representative who has indicated his/her understanding and acceptance.   Dental advisory given  Plan Discussed with: Anesthesiologist  Anesthesia Plan Comments:         Anesthesia Quick Evaluation

## 2012-04-07 NOTE — Progress Notes (Signed)
Lunch releif by S. Gregson RN 

## 2012-04-07 NOTE — Transfer of Care (Signed)
Immediate Anesthesia Transfer of Care Note  Patient: Alexandria [name]  Procedure(s) Performed: Procedure(s) (LRB): TOTAL KNEE ARTHROPLASTY (Left)  Patient Location: PACU  Anesthesia Type: General  Level of Consciousness: awake and oriented  Airway & Oxygen Therapy: Patient Spontanous Breathing and Patient connected to nasal cannula oxygen  Post-op Assessment: Report given to PACU RN, Post -op Vital signs reviewed and stable and Patient moving all extremities X 4  Post vital signs: Reviewed and stable  Complications: No apparent anesthesia complications

## 2012-04-07 NOTE — Preoperative (Signed)
Beta Blockers   Reason not to administer Beta Blockers:Not Applicable. No home beta blockers 

## 2012-04-07 NOTE — Progress Notes (Signed)
Patient ID: Alexandria [name], female   DOB: 07-07-1971, 41 y.o.   MRN: [id number] There has been no change in health status since  the current H&P.I have examined the patient and discussed the surgery. No contraindications to the planned procedure exist.

## 2012-04-07 NOTE — Anesthesia Procedure Notes (Signed)
Anesthesia Regional Block:    Pre-Anesthetic Checklist: ,, timeout performed, Correct Patient, Correct Site, Correct Laterality, Correct Procedure, Correct Position, site marked, Risks and benefits discussed,  Surgical consent,  Pre-op evaluation,  At surgeon's request and post-op pain management  Laterality: Left  Prep: chloraprep       Needles:  Injection technique: Single-shot  Needle Type: Echogenic Stimulator Needle     Needle Length:cm 9 cm Needle Gauge: 22 and 22 G    Additional Needles:  Procedures: ultrasound guided  Narrative:  Start time: 04/07/2012 9:40 AM End time: 04/07/2012 9:50 AM  Performed by: Personally   Additional Notes: L. Mid-thigh Saphenous Nerve Bock:  25 cc 0.5% marcaine with 1:200 Epi injected into L. Adductor canal under US guidance. \ Kipp Brood, MD

## 2012-04-07 NOTE — Progress Notes (Signed)
Orthopedic Tech Progress Note Patient Details:  Alexandria [name] 1971/04/02 [id number]  CPM Left Knee CPM Left Knee: On Left Knee Flexion (Degrees): 50  Left Knee Extension (Degrees): 0    Jennye Moccasin 04/07/2012, 2:11 PM

## 2012-04-07 NOTE — Anesthesia Postprocedure Evaluation (Signed)
  Anesthesia Post-op Note  Patient: Alexandria [name]  Procedure(s) Performed: Procedure(s) (LRB): TOTAL KNEE ARTHROPLASTY (Left)  Patient Location: PACU  Anesthesia Type: General and GA combined with regional for post-op pain  Level of Consciousness: awake, alert  and oriented  Airway and Oxygen Therapy: Patient Spontanous Breathing and Patient connected to nasal cannula oxygen  Post-op Pain: mild  Post-op Assessment: Post-op Vital signs reviewed and Patient's Cardiovascular Status Stable  Post-op Vital Signs: stable  Complications: No apparent anesthesia complications

## 2012-04-07 NOTE — Progress Notes (Signed)
Patient ID: [name], female   DOB: [birth year], 41 y.o.   MRN: [id number] PATIENT ID:      [name]  MRN:     [id number] DOB/AGE:    October 20, 1971 / 42 y.o.       OPERATIVE REPORT    DATE OF PROCEDURE:  04/07/2012       PREOPERATIVE DIAGNOSIS:   osteoarthritis left knee-end stage                                                         Obesity BMI 34  POSTOPERATIVE DIAGNOSIS:   osteoarthritis left knee-end stage                                                                     same   PROCEDURE:  Procedure(s): TOTAL KNEE ARTHROPLASTY     SURGEON: Waller Marcussen W    ASSISTANT:   Jacqualine Code, PA-C   (Present and scrubbed throughout the case, critical for assistance with exposure, retraction, instrumentation, and closure.)          ANESTHESIA: Regional (femoral) with general     DRAINS: Penrose drain in the left knee :      TOURNIQUET TIME:  Total Tourniquet Time Documented: Thigh (Left) - 79 minutes    COMPLICATIONS:  None   CONDITION:  stable  PROCEDURE IN DETAIL: dictation [id number]   [name] 04/07/2012, 11:56 AM

## 2012-04-07 NOTE — Op Note (Signed)
Alexandria [name], [name]     ACCOUNT NO.:  [id number]  MEDICAL RECORD NO.:  [id number]  LOCATION:  5001                         FACILITY:  [facility]  PHYSICIAN:  Claude Manges. Mahmoud Blazejewski, M.D.DATE OF BIRTH:  01/11/1971  DATE OF PROCEDURE:  04/07/2012 DATE OF DISCHARGE:                              OPERATIVE REPORT   PREOPERATIVE DIAGNOSES: 1. End-stage osteoarthritis, left knee. 2. Obesity - Body Mass Index 34.  POSTOPERATIVE DIAGNOSES: 1. End-stage osteoarthritis, left knee. 2. Obesity - Body Mass Index 34.  PROCEDURE:  Left total knee replacement.  SURGEON:  Claude Manges. Cleophas Dunker, M.D.  ASSISTANT:  Arlys John D. Petrarca, PA-C.  ANESTHESIA:  Femoral nerve block with general anesthesia.  COMPLICATIONS:  None.  COMPONENTS:  DePuy LCS standard femoral component, a #4 Revision MBT Tray with a 10-mm rotating polyethylene bridging bearing and metal- backed 3-peg patella.  All were secured with polymethyl methacrylate.  PROCEDURE:  Mrs. Alexandria [name] was met in the [employer], identified the left knee as the appropriate operative site.  Anesthesia performed a femoral nerve block.  The patient was then transported to room #5, where she was placed under general anesthesia without difficulty.  Nursing staff inserted a Foley catheter.  Urine was clear.  Tourniquet was applied to the left thigh.  The leg was then prepped with Betadine scrub and DuraPrep from the tourniquet to the midfoot.  Sterile draping was performed.  With the extremity still elevated, was Esmarch exsanguinated with a proximal tourniquet at 350 mmHg.  A midline longitudinal incision was made, centered about the patella, extending from superior pouch to tibial tubercle via sharp dissection. Incision carried down to subcutaneous tissue.  Small bleeders were Bovie coagulated.  The first layer of capsule was incised in the midline.  A medial parapatellar incision was made with the Bovie.  The joint was entered.  There  was a clear-yellow joint effusion.  The patella was everted at 180 degrees.  The knee was flexed to 90 degrees.  There was a moderate amount of beefy-red synovitis. Synovectomy was performed.  There were large osteophytes along the medial and lateral femoral condyles.  There was almost complete absence of articular cartilage in the medial femoral condyle, probably 50-60% loss of articular cartilage in the lateral femoral condyle with some osteophytes along the medial tibial plateau.  Those osteophytes were removed.  We did template a standard femoral component.  First bony cut was made transversely with a 2-degree angle of declination on the tibia to accept the Revision MBT Tray based on the patient's weight.  At each bony cut, we checked our alignment and felt it was ideal.  Subsequent cuts were made on the femur with a 4-degree distal femoral valgus cut.  Lamina spreaders were inserted into the medial and lateral compartments to remove medial and lateral menisci, ACL, and PCL. Osteophytes removed from the medial and lateral posterior femoral condyles using the curved 3/4 inch osteotome.  MCL and LCL remained intact.  We then checked flexion and extension gaps, both of which were symmetrical at 10 mm.  The final angle cuts were made on the femur using of the finishing guide.  Retractors then placed around the tibia were advanced anteriorly.  We measured a #4 Revision tibial tray.  A center hole was made followed by the keel cut.  With the revision trial jig and the tibial component in place, we then trialed a 10-mm bridging bearing and through a full range of motion, we had full extension, no opening with varus or valgus stress, and negative anterior drawer sign.  Patella was prepared by removing 10 mm of bone leaving 14 mm of patellar thickness.  A 3-peg patellar trial was applied, 3 holes made, the trial patella inserted and reduced, and through a full range of motion,  it remained stable.  The trial components were removed.  The joint was irrigated with saline solution.  The final components were then secured with polymethyl methacrylate.  We initially inserted the Revision MBT #4 tibial tray, followed by the 10-mm bridging bearing and the standard femoral component.  The components were impacted and extraneous methacrylate was removed from the periphery.  The patella was applied with methacrylate and the patellar clamp.  After approximately 16 minutes of methacrylate had hardened during which time, we injected 0.25% Marcaine with epinephrine into the joint surface and deep capsule.  Tourniquet was deflated at 79 minutes.  Gross bleeders were Bovie coagulated.  Hemovac was inserted.  The deep capsule was then closed with interrupted #1 Ethibond, superficial capsule with running 0 Vicryl, subcu with 2-0 Vicryl, 3-0 Monocryl, and then skin clips.  A sterile bulky dressing was applied, followed by the patient's support stocking.  Patient tolerated the procedure well without complications.     Claude Manges. Cleophas Dunker, M.D.     PWW/MEDQ  D:  04/07/2012  T:  04/07/2012  Job:  [id number]

## 2012-04-08 ENCOUNTER — Encounter (HOSPITAL_COMMUNITY): Payer: Self-pay | Admitting: Orthopaedic Surgery

## 2012-04-08 LAB — CBC
HCT: 27.9 % — ABNORMAL LOW (ref 36.0–46.0)
Hemoglobin: 9 g/dL — ABNORMAL LOW (ref 12.0–15.0)
MCV: 81.1 fL (ref 78.0–100.0)
RBC: 3.44 MIL/uL — ABNORMAL LOW (ref 3.87–5.11)
RDW: 12.9 % (ref 11.5–15.5)
WBC: 5.4 10*3/uL (ref 4.0–10.5)

## 2012-04-08 LAB — BASIC METABOLIC PANEL
BUN: 9 mg/dL (ref 6–23)
CO2: 26 mEq/L (ref 19–32)
Chloride: 97 mEq/L (ref 96–112)
Creatinine, Ser: 0.73 mg/dL (ref 0.50–1.10)
GFR calc Af Amer: >90 mL/min (ref >90)
Glucose, Bld: 137 mg/dL — ABNORMAL HIGH (ref 70–99)
Potassium: 3.7 mEq/L (ref 3.5–5.1)

## 2012-04-08 MED ORDER — ACETAMINOPHEN 10 MG/ML IV SOLN
1000.0000 mg | Freq: Four times a day (QID) | INTRAVENOUS | Status: AC
  Administered 2012-04-08 – 2012-04-09 (×4): 1000 mg via INTRAVENOUS
  Filled 2012-04-08 (×4): qty 100

## 2012-04-08 NOTE — Discharge Instructions (Signed)
Home Health to be provided by [facility] [phone] 

## 2012-04-08 NOTE — Progress Notes (Signed)
Patient ID: Alexandria [name], female   DOB: 1971/02/09, 41 y.o.   MRN: [id number] PATIENT ID: Alexandria [name]        MRN:  [id number]          DOB/AGE: [birth year] / 41 y.o.  Alexandria Campbell, MD   Jacqualine Code, PA-C [address] [place], [place], [place]  [address]                             [phone]   PROGRESS NOTE  Subjective:  negative for Chest Pain  negative for Shortness of Breath  negative for Nausea/Vomiting   mild for Calf Pain  negative for Bowel Movement   Tolerating Diet: yes         Patient reports pain as mild.    Objective: Vital signs in last 24 hours:   Patient Vitals for the past 24 hrs:  BP Temp Temp src Pulse Resp SpO2 Height [name]  04/08/12 0636 96/59 mmHg 97.9 F (36.6 C) Oral 70  14  98 % - -  04/07/12 1447 133/77 mmHg 98 F (36.7 C) Oral 67  12  100 % 5\' 6"  (1.676 m) 99.2 kg (218 lb 11.1 oz)  04/07/12 1424 130/79 mmHg - - 62  14  100 % - -  04/07/12 1418 - 97 F (36.1 C) - - - - - -  04/07/12 1415 - - - 65  15  100 % - -  04/07/12 1408 128/80 mmHg - - - - - - -  04/07/12 1400 - - - 61  16  100 % - -  04/07/12 1353 125/79 mmHg - - - - - - -  04/07/12 1345 - - - 61  17  100 % - -  04/07/12 1338 125/83 mmHg - - - - - - -  04/07/12 1330 - - - 60  17  100 % - -  04/07/12 1323 130/76 mmHg - - - - - - -  04/07/12 1315 - - - - 14  - - -  04/07/12 1308 134/86 mmHg - - 63  9  100 % - -  04/07/12 1300 - - - 63  8  100 % - -  04/07/12 1256 - - - 64  20  100 % - -  04/07/12 1253 139/90 mmHg - - - - - - -  04/07/12 1245 - - - 67  9  100 % - -  04/07/12 1238 142/82 mmHg - - - - - - -  04/07/12 1230 - 97.9 F (36.6 C) - 73  15  100 % - -  04/07/12 1223 147/84 mmHg - - - - - - -  04/07/12 1215 - - - 61  16  100 % - -  04/07/12 0918 - - - 60  - 100 % - -  04/07/12 0916 - - - 61  - 100 % - -  04/07/12 0829 118/74 mmHg 97.9 F (36.6 C) Oral 67  18  98 % - -      Intake/Output from previous day:   04/30 0701 - 05/01 0700 In: 2675  [I.V.:2675] Out: 1905 [Urine:1400; Drains:355]   Intake/Output this shift:       Intake/Output      04/30 0701 - 05/01 0700 05/01 0701 - 05/02 0700   I.V. (mL/kg) 2675 (27)    Total Intake(mL/kg) 2675 (27)    Urine (mL/kg/hr) 1400 (0.6)  Drains 355    Blood 150    Total Output 1905    Net +770            LABORATORY DATA:  Basename 04/08/12 0635  WBC 5.4  HGB 9.0*  HCT 27.9*  PLT 238    Basename 04/08/12 0635  NA 131*  K 3.7  CL 97  CO2 26  BUN 9  CREATININE 0.73  GLUCOSE 137*  CALCIUM 8.2*   Lab Results  Component Value Date   INR 0.90 03/30/2012   INR 0.89 11/25/2010   INR 1.80* 04/09/2010    Examination:  General appearance: alert, cooperative and no distress  Wound Exam: clean, dry, intact   Drainage:  80 cc from hemovac in last 8 hours-D/C this pm or in am  Motor Exam: EHL, FHL, Anterior Tibial and Posterior Tibial Intact  Sensory Exam: Superficial Peroneal, Deep Peroneal and Tibial normal  Vascular Exam: Normal  Assessment:    1 Day Post-Op  Procedure(s) (LRB): TOTAL KNEE ARTHROPLASTY (Left)  ADDITIONAL DIAGNOSIS:  Active Problems:  * No active hospital problems. *   Acute Blood Loss Anemia   Plan: Physical Therapy as ordered Partial [name] Bearing @ 50% (PWB)  DVT Prophylaxis:  Xarelto  DISCHARGE PLAN: Home  DISCHARGE NEEDS: HHPT   OOB with PT-good night      Alexandria [name] W 04/08/2012, 7:58 AM

## 2012-04-08 NOTE — Progress Notes (Signed)
CARE MANAGEMENT NOTE 04/08/2012  Patient:  Alexandria [name],Alexandria [name]   Account Number:  [id number]  Date Initiated:  04/08/2012  Documentation initiated by:  Vance Peper  Subjective/Objective Assessment:   41 yr old female s/p left total knee arthroplasty     Action/Plan:   Spoke with patient regarding Home Health needs. Preoperatively setup with [facility], no changes. rolling walker, 3in1 and CPM have been delivered to the home.Has family support at discharge.   Anticipated DC Date:  04/10/2012   Anticipated DC Plan:  HOME W HOME HEALTH SERVICES      DC Planning Services  CM consult      [facility] Choice  HOME HEALTH   Choice offered to / List presented to:  C-1 Patient        HH arranged  HH-2 PT      [facility] agency  [facility]   Status of service:  Completed, signed off   Discharge Disposition:  HOME W HOME HEALTH SERVICES

## 2012-04-08 NOTE — Progress Notes (Signed)
UR COMPLETED  

## 2012-04-08 NOTE — Progress Notes (Signed)
Agree with treatment note.  04/08/2012 Cephus Shelling, PT, DPT [phone]

## 2012-04-08 NOTE — Progress Notes (Signed)
Physical Therapy Treatment Patient Details Name: Alexandria [name] MRN: [id number] DOB: 13-Oct-1971 Today's Date: 04/08/2012 Time: 4098-1191 PT Time Calculation (min): 36 min  PT Assessment / Plan / Recommendation Comments on Treatment Session       Follow Up Recommendations  Home health PT    Equipment Recommendations  None recommended by PT    Frequency 7X/week   Plan      Precautions / Restrictions Precautions Precautions: Knee Precaution Booklet Issued: No Restrictions Weight Bearing Restrictions: Yes LLE Weight Bearing: Partial weight bearing LLE Partial Weight Bearing Percentage or Pounds: 50   Pertinent Vitals/Pain 6/10 in R knee. Communicated with RN and RN brought pain medicine.      Mobility  Bed Mobility Bed Mobility: Sit to Supine Sit to Supine: 5: Supervision Details for Bed Mobility Assistance: Cueing for hand and L LE placement.  Transfers Transfers: Sit to Stand;Stand to Sit Sit to Stand: 4: Min guard;From chair/3-in-1;With armrests;With upper extremity assist Stand to Sit: 4: Min guard;With upper extremity assist;To bed Details for Transfer Assistance: Guard for balance. Cueing for sequencing.  Ambulation/Gait Ambulation/Gait Assistance: 4: Min guard Ambulation Distance (Feet): 40 Feet Assistive device: Rolling walker Ambulation/Gait Assistance Details: Guard for balance.  Gait Pattern: Step-to pattern;Decreased step length - left;Decreased stance time - left Stairs: No Wheelchair Mobility Wheelchair Mobility: No    Exercises Total Joint Exercises Ankle Circles/Pumps: AROM;Left;10 reps;Seated Quad Sets: AROM;10 reps;Seated;Left Heel Slides: Left;AROM;10 reps;Seated   PT Goals Acute Rehab PT Goals PT Goal Formulation: With patient Time For Goal Achievement: 04/15/12 Potential to Achieve Goals: Good Pt will go Sit to Stand: with modified independence PT Goal: Sit to Stand - Progress: Goal set today Pt will go Stand to Sit: with modified  independence PT Goal: Stand to Sit - Progress: Goal set today Pt will Ambulate: >150 feet;with modified independence;with rolling walker PT Goal: Ambulate - Progress: Goal set today Pt will Go Up / Down Stairs: 6-9 stairs;with modified independence;with rolling walker PT Goal: Up/Down Stairs - Progress: Goal set today Pt will Perform Home Exercise Program: Independently PT Goal: Perform Home Exercise Program - Progress: Goal set today  Visit Information  Last PT Received On: 04/08/12 Assistance Needed: +1    Subjective Data  Subjective: "I know I have to do it." Patient Stated Goal: Go home.    Cognition  Overall Cognitive Status: Appears within functional limits for tasks assessed/performed Arousal/Alertness: Awake/alert Orientation Level: Oriented X4 / Intact Behavior During Session: [facility] for tasks performed    Balance  Balance Balance Assessed: No  End of Session PT - End of Session Equipment Utilized During Treatment: Gait belt Activity Tolerance: Patient tolerated treatment well;Patient limited by pain Patient left: in bed;in CPM;with call bell/phone within reach Nurse Communication: Mobility status;Patient requests pain meds CPM Left Knee CPM Left Knee: On Left Knee Flexion (Degrees): 55  Left Knee Extension (Degrees): 0     Oretha Ellis 04/08/2012, 12:11 PM

## 2012-04-08 NOTE — Progress Notes (Signed)
Orthopedic Tech Progress Note Patient Details:  Alexandria [name] 03-Nov-1971 [id number]  Patient ID: Alexandria [name], female   DOB: 06/18/1971, 41 y.o.   MRN: [id number]   [name] 04/08/2012, 9:40 AM Trapeze bar

## 2012-04-08 NOTE — Progress Notes (Signed)
Agree with evaluation and goals.  04/08/2012 Cephus Shelling, PT, DPT [phone]

## 2012-04-08 NOTE — Progress Notes (Signed)
Physical Therapy Treatment Patient Details Name: Alexandria [name] MRN: [id number] DOB: 1971/05/04 Today's Date: 04/08/2012 Time: 7846-9629 PT Time Calculation (min): 19 min  PT Assessment / Plan / Recommendation Comments on Treatment Session  Pt admitted s/p L TKA. Very lethargic and only able to tolerate exercises in bed. Will continue to benefit from PT to follow D/C plan home for HHPT.     Follow Up Recommendations  Home health PT    Equipment Recommendations  None recommended by PT    Frequency 7X/week   Plan Discharge plan remains appropriate;Frequency remains appropriate    Precautions / Restrictions Precautions Precautions: Knee Precaution Booklet Issued: No Restrictions Weight Bearing Restrictions: Yes LLE Weight Bearing: Partial weight bearing LLE Partial Weight Bearing Percentage or Pounds: 50   Pertinent Vitals/Pain 8/10 on L knee. Premedicated by nurse. Left with ice pack on L knee.    Mobility  Bed Mobility Bed Mobility: Not assessed Transfers Transfers: Not assessed Ambulation/Gait Stairs: No Wheelchair Mobility Wheelchair Mobility: No    Exercises Total Joint Exercises Ankle Circles/Pumps: AROM;Left;10 reps;Supine Quad Sets: AROM;10 reps;Supine;Left Short Arc QuadBarbaraann Boys;Left;10 reps;Supine Heel Slides: AROM;Left;10 reps;Supine Hip ABduction/ADduction: AROM;Left;10 reps;Supine Straight Leg Raises: AAROM;Left;10 reps;Supine   PT Goals Acute Rehab PT Goals PT Goal Formulation: With patient Time For Goal Achievement: 04/15/12 Potential to Achieve Goals: Good PT Goal: Perform Home Exercise Program - Progress: Progressing toward goal  Visit Information  Last PT Received On: 04/08/12 Assistance Needed: +1    Subjective Data  Subjective: "I'm in a lot more pain, and they gave me an IV." Patient Stated Goal: Go home.   Cognition  Overall Cognitive Status: Appears within functional limits for tasks assessed/performed Arousal/Alertness:  Lethargic Orientation Level: Oriented X4 / Intact Behavior During Session: [facility] for tasks performed    Balance  Balance Balance Assessed: No  End of Session PT - End of Session Activity Tolerance: Patient limited by pain;Patient limited by fatigue Patient left: in bed;in CPM;with call bell/phone within reach (CPM L knee 0-57) Nurse Communication: Mobility status    Oretha Ellis 04/08/2012, 2:25 PM

## 2012-04-09 LAB — CBC
HCT: 26.9 % — ABNORMAL LOW (ref 36.0–46.0)
Hemoglobin: 8.8 g/dL — ABNORMAL LOW (ref 12.0–15.0)
MCHC: 32.7 g/dL (ref 30.0–36.0)
MCV: 81.5 fL (ref 78.0–100.0)
RDW: 13.1 % (ref 11.5–15.5)

## 2012-04-09 LAB — BASIC METABOLIC PANEL
BUN: 7 mg/dL (ref 6–23)
Creatinine, Ser: 0.71 mg/dL (ref 0.50–1.10)
GFR calc non Af Amer: >90 mL/min (ref >90)
Glucose, Bld: 112 mg/dL — ABNORMAL HIGH (ref 70–99)
Potassium: 3.6 mEq/L (ref 3.5–5.1)

## 2012-04-09 MED ORDER — ESOMEPRAZOLE MAGNESIUM 40 MG PO CPDR
40.0000 mg | DELAYED_RELEASE_CAPSULE | Freq: Every day | ORAL | Status: DC
  Administered 2012-04-10: 40 mg via ORAL
  Filled 2012-04-09 (×2): qty 1

## 2012-04-09 MED ORDER — WHITE PETROLATUM GEL
Status: AC
  Administered 2012-04-09: 22:00:00
  Filled 2012-04-09: qty 5

## 2012-04-09 MED ORDER — NON FORMULARY: Freq: Every day | Status: DC

## 2012-04-09 NOTE — Progress Notes (Signed)
Chart reviewed, and spoke with pt.  No OT needs identified.  Will sign off. Jeani Hawking, OTR/L [phone]

## 2012-04-09 NOTE — Progress Notes (Signed)
Physical Therapy Treatment Patient Details Name: Alexandria [name] MRN: [id number] DOB: [birth year] Today's Date: 04/09/2012 Time: 4098-1191 PT Time Calculation (min): 29 min  PT Assessment / Plan / Recommendation Comments on Treatment Session  Pt admitted s/p L TKA and continues to progress well. Completed stair training this PM and ambulated greater distance. Pt will benefit from continued PT to follow D/C plan home for HHPT.     Follow Up Recommendations  Home health PT    Equipment Recommendations  None recommended by PT    Frequency 7X/week   Plan Discharge plan remains appropriate;Frequency remains appropriate    Precautions / Restrictions Precautions Precautions: Knee Precaution Booklet Issued: No Restrictions Weight Bearing Restrictions: Yes LLE Weight Bearing: Partial weight bearing LLE Partial Weight Bearing Percentage or Pounds: 50   Pertinent Vitals/Pain 6/10 at L knee. Nurse aware.     Mobility  Bed Mobility Bed Mobility: Sit to Supine Sit to Supine: 5: Supervision Details for Bed Mobility Assistance: Cueing for hooking R LE under L LE to lift both LE up onto the bed. Transfers Transfers: Sit to Stand;Stand to Sit (Trials x 2) Sit to Stand: 5: Supervision;With upper extremity assist;With armrests;From chair/3-in-1 Stand to Sit: 5: Supervision;With upper extremity assist;To bed;To chair/3-in-1;With armrests Details for Transfer Assistance: Cueing for L LE placement and walker placement and cueing for hooking R LE under L LE when transitioning in recliner.  Ambulation/Gait Ambulation/Gait Assistance: 5: Supervision Ambulation Distance (Feet): 110 Feet Assistive device: Rolling walker Ambulation/Gait Assistance Details: Cueing for upright posture and walker placement.  Gait Pattern: Step-to pattern;Decreased step length - left;Decreased stance time - left Stairs: Yes Stairs Assistance: 4: Min guard Stair Management Technique: Two rails;Step to  pattern;Forwards ( Trials x 2) Number of Stairs: 2  (Trials x 2) Wheelchair Mobility Wheelchair Mobility: No    Exercises     PT Goals Acute Rehab PT Goals PT Goal Formulation: With patient Time For Goal Achievement: 04/15/12 Potential to Achieve Goals: Good PT Goal: Sit to Stand - Progress: Progressing toward goal PT Goal: Stand to Sit - Progress: Progressing toward goal PT Goal: Ambulate - Progress: Progressing toward goal PT Goal: Up/Down Stairs - Progress: Progressing toward goal  Visit Information  Last PT Received On: 04/09/12 Assistance Needed: +1    Subjective Data  Subjective: "I'm feeling better." Patient Stated Goal: Go home.    Cognition  Overall Cognitive Status: Appears within functional limits for tasks assessed/performed Arousal/Alertness: Awake/alert Orientation Level: Oriented X4 / Intact Behavior During Session: [facility] for tasks performed    Balance  Balance Balance Assessed: No  End of Session PT - End of Session Equipment Utilized During Treatment: Gait belt Activity Tolerance: Patient tolerated treatment well;Patient limited by fatigue;Patient limited by pain Patient left: in bed;with call bell/phone within reach;in CPM (CPM set 0-60 degrees on L knee. ) Nurse Communication: Mobility status    Oretha Ellis 04/09/2012, 2:22 PM

## 2012-04-09 NOTE — Progress Notes (Signed)
Agree with treatment.  04/09/2012 Cephus Shelling, PT, DPT [phone]

## 2012-04-09 NOTE — Progress Notes (Signed)
Patient ID: Jahzara Slattery, female   DOB: 1971-07-25, 41 y.o.   MRN: [id number] PATIENT ID: Chyenne Sobczak        MRN:  [id number]          DOB/AGE: [birth year] / 41 y.o.  [name], MD   Jacqualine Code, PA-C [address] [place], [place], [place]  [address]                             [phone]   PROGRESS NOTE  Subjective:  negative for Chest Pain  negative for Shortness of Breath  negative for Nausea/Vomiting   negative for Calf Pain  positive for Bowel Movement   Tolerating Diet: yes         Patient reports pain as mild.    Objective: Vital signs in last 24 hours:   Patient Vitals for the past 24 hrs:  BP Temp Pulse Resp SpO2  04/08/12 2105 133/73 mmHg 98.9 F (37.2 C) 79  16  97 %      Intake/Output from previous day:   05/01 0701 - 05/02 0700 In: 720 [P.O.:720] Out: 525 [Urine:400; Drains:125]   Intake/Output this shift:       Intake/Output      05/01 0701 - 05/02 0700 05/02 0701 - 05/03 0700   P.O. 720    I.V. (mL/kg)     Total Intake(mL/kg) 720 (7.3)    Urine (mL/kg/hr) 400 (0.2)    Drains 125    Blood     Total Output 525    Net +195         Urine Occurrence 1 x 1 x      LABORATORY DATA:  Basename 04/09/12 0628 04/08/12 0635  WBC 5.8 5.4  HGB 8.8* 9.0*  HCT 26.9* 27.9*  PLT 238 238    Basename 04/09/12 0628 04/08/12 0635  NA 134* 131*  K 3.6 3.7  CL 97 97  CO2 25 26  BUN 7 9  CREATININE 0.71 0.73  GLUCOSE 112* 137*  CALCIUM 8.5 8.2*   Lab Results  Component Value Date   INR 0.90 03/30/2012   INR 0.89 11/25/2010   INR 1.80* 04/09/2010    Examination:  General appearance: alert, cooperative and no distress  Wound Exam: clean, dry, intact   Drainage:  None: wound tissue dry  Motor Exam: EHL, FHL, Anterior Tibial and Posterior Tibial Intact  Sensory Exam:  Assessment:    2 Days Post-Op  Procedure(s) (LRB): TOTAL KNEE ARTHROPLASTY (Left)  ADDITIONAL DIAGNOSIS:  Active Problems:  * No active hospital  problems. *   Acute Blood Loss Anemia   Plan: Physical Therapy as ordered Partial Weight Bearing @ 50% (PWB)  DVT Prophylaxis:  Xarelto  DISCHARGE PLAN: Home  DISCHARGE NEEDS: HHPT and Walker  Plan on D/C tomorrow, did well today       Micaila Ziemba W 04/09/2012, 5:56 PM

## 2012-04-09 NOTE — Progress Notes (Signed)
Referral received for SNF. Chart reviewed and CSW has spoken with RNCM who indicates that patient is for DC to home with Home Health and DME.  CSW to sign off. Please re-consult if CSW needs arise.  Lydiana Milley T. Delorise Hunkele, BSW  [phone]  

## 2012-04-09 NOTE — Progress Notes (Signed)
Physical Therapy Treatment Patient Details Name: Alexandria [name] MRN: [id number] DOB: [birth year] Today's Date: 04/09/2012 Time: 4098-1191 PT Time Calculation (min): 28 min  PT Assessment / Plan / Recommendation Comments on Treatment Session  Pt admitted s/p L TKA and is progressing well. ROM is increasing and increased ambulation distance this AM. Will attempt stairs this PM. Pt will benefit from continued PT to follow D/C plan home for HHPT.    Follow Up Recommendations  Home health PT    Equipment Recommendations  None recommended by PT    Frequency 7X/week   Plan Discharge plan remains appropriate;Frequency remains appropriate    Precautions / Restrictions Precautions Precautions: Knee Precaution Booklet Issued: No Restrictions Weight Bearing Restrictions: Yes LLE Weight Bearing: Partial weight bearing LLE Partial Weight Bearing Percentage or Pounds: 50   Pertinent Vitals/Pain 6/10 at L Knee. Premedicated by RN.    Mobility  Bed Mobility Bed Mobility: Supine to Sit Supine to Sit: 5: Supervision Sit to Supine: 5: Supervision Details for Bed Mobility Assistance: Cueing for hand and L LE placement.  Transfers Transfers: Sit to Stand;Stand to Sit Sit to Stand: 4: Min guard;From chair/3-in-1;With armrests;With upper extremity assist Stand to Sit: 4: Min guard;With upper extremity assist;To bed Details for Transfer Assistance: Guard for balance. Cueing for hand placement and L LE placement.  Ambulation/Gait Ambulation/Gait Assistance: 4: Min guard Ambulation Distance (Feet): 80 Feet Assistive device: Rolling walker Ambulation/Gait Assistance Details: Guard for balance. Cueing for tall posture and hand placement on walker.  Gait Pattern: Step-to pattern;Decreased step length - left;Decreased stance time - left Stairs: No Wheelchair Mobility Wheelchair Mobility: No    Exercises Total Joint Exercises Ankle Circles/Pumps: AROM;Left;10 reps;Supine Quad Sets: AROM;10  reps;Supine;Left Short Arc QuadBarbaraann Boys;Left;10 reps;Supine Heel Slides: AROM;Left;10 reps;Supine Hip ABduction/ADduction: AROM;Left;10 reps;Supine Straight Leg Raises: AAROM;Left;10 reps;Supine Knee Flexion: AROM;Supine;Left (L Knee AROM 0-66 degrees)   PT Goals Acute Rehab PT Goals PT Goal Formulation: With patient Time For Goal Achievement: 04/15/12 Potential to Achieve Goals: Good PT Goal: Sit to Stand - Progress: Progressing toward goal PT Goal: Stand to Sit - Progress: Progressing toward goal PT Goal: Ambulate - Progress: Progressing toward goal PT Goal: Perform Home Exercise Program - Progress: Progressing toward goal  Visit Information  Last PT Received On: 04/09/12 Assistance Needed: +1    Subjective Data  Subjective: "I'm feeling a little better, not as groggy." Patient Stated Goal: Go home.   Cognition  Overall Cognitive Status: Appears within functional limits for tasks assessed/performed Arousal/Alertness: Awake/alert Orientation Level: Oriented X4 / Intact Behavior During Session: [facility] for tasks performed    Balance  Balance Balance Assessed: No  End of Session PT - End of Session Equipment Utilized During Treatment: Gait belt Activity Tolerance: Patient tolerated treatment well;Patient limited by fatigue;Patient limited by pain Patient left: in bed;with call bell/phone within reach Nurse Communication: Mobility status    Oretha Ellis 04/09/2012, 12:16 PM

## 2012-04-09 NOTE — Progress Notes (Signed)
Agree with treatment.  Pt progressing.  04/09/2012 Cephus Shelling, PT, DPT [phone]

## 2012-04-09 NOTE — Progress Notes (Signed)
Referral received for SNF. Chart reviewed and CSW has spoken with RNCM who indicates that patient is for DC to home with Home Health and DME.  CSW to sign off. Please re-consult if CSW needs arise.  Arash Karstens T. Jhordan Mckibben, BSW  [phone]  

## 2012-04-10 DIAGNOSIS — Z86718 Personal history of other venous thrombosis and embolism: Secondary | ICD-10-CM

## 2012-04-10 DIAGNOSIS — J45909 Unspecified asthma, uncomplicated: Secondary | ICD-10-CM | POA: Diagnosis present

## 2012-04-10 DIAGNOSIS — K219 Gastro-esophageal reflux disease without esophagitis: Secondary | ICD-10-CM | POA: Diagnosis present

## 2012-04-10 DIAGNOSIS — IMO0001 Reserved for inherently not codable concepts without codable children: Secondary | ICD-10-CM | POA: Diagnosis present

## 2012-04-10 DIAGNOSIS — M1710 Unilateral primary osteoarthritis, unspecified knee: Secondary | ICD-10-CM | POA: Diagnosis present

## 2012-04-10 DIAGNOSIS — I10 Essential (primary) hypertension: Secondary | ICD-10-CM | POA: Diagnosis present

## 2012-04-10 LAB — BASIC METABOLIC PANEL
BUN: 4 mg/dL — ABNORMAL LOW (ref 6–23)
Calcium: 8.8 mg/dL (ref 8.4–10.5)
GFR calc Af Amer: >90 mL/min (ref >90)
GFR calc non Af Amer: >90 mL/min (ref >90)
Glucose, Bld: 101 mg/dL — ABNORMAL HIGH (ref 70–99)
Sodium: 134 mEq/L — ABNORMAL LOW (ref 135–145)

## 2012-04-10 LAB — CBC
HCT: 24.9 % — ABNORMAL LOW (ref 36.0–46.0)
Hemoglobin: 8.2 g/dL — ABNORMAL LOW (ref 12.0–15.0)
MCH: 26.7 pg (ref 26.0–34.0)
MCHC: 32.9 g/dL (ref 30.0–36.0)
RDW: 12.9 % (ref 11.5–15.5)

## 2012-04-10 MED ORDER — RIVAROXABAN 10 MG PO TABS: 10.0000 mg | ORAL_TABLET | Freq: Every day | ORAL | Status: DC

## 2012-04-10 MED ORDER — METHOCARBAMOL 500 MG PO TABS: 500.0000 mg | ORAL_TABLET | Freq: Four times a day (QID) | ORAL | Status: AC | PRN

## 2012-04-10 MED ORDER — OXYCODONE HCL 5 MG PO TABS: 5.0000 mg | ORAL_TABLET | ORAL | Status: AC | PRN

## 2012-04-10 NOTE — Progress Notes (Signed)
Physical Therapy Treatment Patient Details Name: Alexandria [name] MRN: [id number] DOB: 04-11-1971 Today's Date: 04/10/2012 Time: 4098-1191 PT Time Calculation (min): 23 min  PT Assessment / Plan / Recommendation Comments on Treatment Session  Pt admitted s/p L TKA and progressing well. Pt ready for safe D/C once medically stable by MD.     Follow Up Recommendations  Home health PT    Equipment Recommendations  None recommended by PT    Frequency 7X/week   Plan Discharge plan remains appropriate;Frequency remains appropriate    Precautions / Restrictions Precautions Precautions: Knee Precaution Booklet Issued: No Restrictions Weight Bearing Restrictions: Yes LLE Weight Bearing: Partial weight bearing LLE Partial Weight Bearing Percentage or Pounds: 50   Pertinent Vitals/Pain 6/10 at L knee. Premedicated by RN. Left with ice pack.     Mobility  Bed Mobility Bed Mobility: Sit to Supine;Supine to Sit Supine to Sit: 6: Modified independent (Device/Increase time) Sit to Supine: 6: Modified independent (Device/Increase time) Details for Bed Mobility Assistance: Pt able to hook R LE under L LE to bring to EOB with no cueing.  Transfers Transfers: Sit to Stand;Stand to Sit Sit to Stand: 6: Modified independent (Device/Increase time) Stand to Sit: 6: Modified independent (Device/Increase time) Ambulation/Gait Ambulation/Gait Assistance: 5: Supervision Ambulation Distance (Feet): 110 Feet Assistive device: Rolling walker Ambulation/Gait Assistance Details: Cueing for step through pattern of gait.  Gait Pattern: Step-through pattern;Decreased step length - left;Decreased stance time - left (Working on step-through pattern) Stairs: Yes Stairs Assistance: 5: Supervision Stairs Assistance Details (indicate cue type and reason): Cueing for "up with good and down with bad." Stair Management Technique: Two rails;Step to pattern;Forwards Number of Stairs: 2  Wheelchair  Mobility Wheelchair Mobility: No    Exercises Total Joint Exercises Ankle Circles/Pumps: AROM;Left;10 reps;Supine Quad Sets: AROM;10 reps;Supine;Left Short Arc QuadBarbaraann Boys;Left;10 reps;Supine Heel Slides: AROM;Left;10 reps;Supine Hip ABduction/ADduction: AROM;Left;10 reps;Supine Straight Leg Raises: AAROM;Left;10 reps;Supine Knee Flexion: AROM;Supine;Left (Knee AROM 0-55 degrees. )   PT Goals Acute Rehab PT Goals PT Goal Formulation: With patient Time For Goal Achievement: 04/15/12 Potential to Achieve Goals: Good PT Goal: Sit to Stand - Progress: Met PT Goal: Stand to Sit - Progress: Met PT Goal: Ambulate - Progress: Progressing toward goal PT Goal: Up/Down Stairs - Progress: Progressing toward goal PT Goal: Perform Home Exercise Program - Progress: Progressing toward goal  Visit Information  Last PT Received On: 04/10/12 Assistance Needed: +1    Subjective Data  Subjective: "I'm ready to go home."  Patient Stated Goal: Go home.    Cognition  Overall Cognitive Status: Appears within functional limits for tasks assessed/performed Arousal/Alertness: Awake/alert Orientation Level: Oriented X4 / Intact Behavior During Session: [facility] for tasks performed    Balance  Balance Balance Assessed: No  End of Session PT - End of Session Equipment Utilized During Treatment: Gait belt Activity Tolerance: Patient tolerated treatment well Patient left: in bed;with call bell/phone within reach Nurse Communication: Mobility status CPM Left Knee CPM Left Knee: Off    Oretha Ellis 04/10/2012, 12:03 PM

## 2012-04-10 NOTE — Progress Notes (Signed)
Alexandria [name] Patient called for pain medicine. Assisted patient to the bathroom. She blew her nose and it was bleeding. Very small amount of blood on tissue. Patient stated her nose was dry. Applied saline to some Q-tips and cleaned nares and applied petroleum jelly to nares as well. Patient then complained of headache and questioned taking blood pressure medicine today. Told patient that she took Norvasc 10mg  and HCTZ 25mg . Patient stated she does not take those two together. She takes either or, not both. VS WNL. No further episodes of nose bleeds. Patient wanted to hold Xarelto to prevent any more nose bleeds. Patient still c/o headache, stating it must be her allergies. Will continue to monitor.

## 2012-04-10 NOTE — Progress Notes (Signed)
Patient ID: Alexandria [name], female   DOB: February 03, 1971, 41 y.o.   MRN: [id number] PATIENT ID: Alexandria [name]        MRN:  [id number]          DOB/AGE: [birth year] / 41 y.o.  [name], MD   Jacqualine Code, PA-C [address] [place], [place], [place]  [address]                             [phone]   PROGRESS NOTE  Subjective:  negative for Chest Pain  negative for Shortness of Breath  negative for Nausea/Vomiting   negative for Calf Pain  negative for Bowel Movement   Tolerating Diet: yes         Patient reports pain as mild.    Objective: Vital signs in last 24 hours:   Patient Vitals for the past 24 hrs:  BP Temp Pulse Resp SpO2  04/10/12 0539 115/56 mmHg 98.4 F (36.9 C) 87  16  96 %      Intake/Output from previous day:       Intake/Output this shift:       Intake/Output      05/02 0701 - 05/03 0700 05/03 0701 - 05/04 0700   P.O.     Total Intake(mL/kg)     Urine (mL/kg/hr)     Drains     Total Output     Net          Urine Occurrence 1 x       LABORATORY DATA:  Basename 04/10/12 0640 04/09/12 0628 04/08/12 0635  WBC 5.7 5.8 5.4  HGB 8.2* 8.8* 9.0*  HCT 24.9* 26.9* 27.9*  PLT 217 238 238    Basename 04/10/12 0640 04/09/12 0628 04/08/12 0635  NA 134* 134* 131*  K 3.4* 3.6 3.7  CL 94* 97 97  CO2 33* 25 26  BUN 4* 7 9  CREATININE 0.74 0.71 0.73  GLUCOSE 101* 112* 137*  CALCIUM 8.8 8.5 8.2*   Lab Results  Component Value Date   INR 0.90 03/30/2012   INR 0.89 11/25/2010   INR 1.80* 04/09/2010    Examination:  General appearance: alert, cooperative and no distress  Wound Exam: clean, dry, intact   Drainage:  None: wound tissue dry  Motor Exam: EHL, FHL, Anterior Tibial and Posterior Tibial Intact  Sensory Exam: Superficial Peroneal, Deep Peroneal and Tibial normal  Vascular Exam: Normal  Assessment:    3 Days Post-Op  Procedure(s) (LRB): TOTAL KNEE ARTHROPLASTY (Left)  ADDITIONAL DIAGNOSIS:  Active Problems:  *  No active hospital problems. *   Acute Blood Loss Anemia   Plan: Physical Therapy as ordered Partial Weight Bearing @ 50% (PWB)  DVT Prophylaxis:  Xarelto  DISCHARGE PLAN: Home  DISCHARGE NEEDS: HHPT   will D/C today, great effort in PT. H&H decreased but without symptoms      Javontay Vandam W 04/10/2012, 7:48 AM

## 2012-04-10 NOTE — Discharge Summary (Signed)
Alexandria Campbell, MD   Jacqualine Code, PA-C [address] [place], [place], [place]  [address]                             [phone]  PATIENT ID: Alexandria [name]        MRN:  [id number]          DOB/AGE: 43-Sep-1972 / 43 y.o.    DISCHARGE SUMMARY  ADMISSION DATE:    04/07/2012 DISCHARGE DATE:   04/10/2012   ADMISSION DIAGNOSIS: osteoarthritis left knee    DISCHARGE DIAGNOSIS:  osteoarthritis left knee    ADDITIONAL DIAGNOSIS: Principal Problem:  *Osteoarthritis of knee Active Problems:  GERD (gastroesophageal reflux disease)  Hypertension  Asthma  Obesity, Class II, BMI 35-39.9, with comorbidity  History of DVT of lower extremity  Past Medical History  Diagnosis Date  . Hypertension   . Asthma   . Blood transfusion 2011    with right tka  . GERD (gastroesophageal reflux disease)   . Arthritis   . Fibromyalgia   . H/O blood clots 2011-2012    post op clots calf right leg.Marland Kitchen     PROCEDURE: Procedure(s): LEFT TOTAL KNEE ARTHROPLASTY on 04/07/2012  CONSULTS:   NONE  HISTORY: Alexandria [name] is a very pleasant 41 year old African American female who is seen today for evaluation of her left knee. She is status post right total knee arthroplasty with good results. However, her left knee is starting to become very symptomatic. She is now having this constant extremely severe aching pain with marked weakness secondary to the pain. It is getting worse and it really even wakes her at nighttime. Everything makes her symptoms worse. Ice and elevation have been helpful. She is using antispasmodics and Cymbalta and has had multiple injections, bracing, and physical therapy, but unfortunately she continues to have worsening pain to the point where she is having difficulty with doing her activities of daily living and actually just being able to ambulate at all.   HOSPITAL COURSE:  Alexandria [name] is a 41 y.o. admitted on 04/07/2012 and found to have a diagnosis of osteoarthritis  left knee.  After appropriate laboratory studies were obtained  they were taken to the operating room on 04/07/2012 and underwent Procedure(s): LEFT TOTAL KNEE ARTHROPLASTY.   They were given perioperative antibiotics:  Anti-infectives     Start     Dose/Rate Route Frequency Ordered Stop   04/07/12 1600   ceFAZolin (ANCEF) IVPB 2 g/50 mL premix        2 g 100 mL/hr over 30 Minutes Intravenous Every 6 hours 04/07/12 1502 04/08/12 0349   04/07/12 0600   ceFAZolin (ANCEF) IVPB 2 g/50 mL premix        2 g 100 mL/hr over 30 Minutes Intravenous 60 min pre-op 04/06/12 1338 04/07/12 0959        .  Tolerated the procedure well.  Placed with a foley intraoperatively.  Given Ofirmev at induction and for 48 hours.    POD #1, allowed out of bed to a chair.  PT for ambulation and exercise program.  Foley D/C'd in morning.  IV saline locked.  O2 discontionued.  POD #2, continued PT and ambulation.   Hemovac pulled.  POD #3,  Continued with PT.  Pain controlled . The remainder of the hospital course was dedicated to ambulation and strengthening.   The patient was discharged on 3 Days Post-Op in  Stable condition.  Blood products given:none  DIAGNOSTIC STUDIES: Recent vital signs:  Patient Vitals for the past 24 hrs:  BP Temp Pulse Resp SpO2  04/10/12 0539 115/56 mmHg 98.4 F (36.9 C) 87  16  96 %       Recent laboratory studies:  Basename 04/10/12 0640 2012-04-22 0628 04/08/12 0635  WBC 5.7 5.8 5.4  HGB 8.2* 8.8* 9.0*  HCT 24.9* 26.9* 27.9*  PLT 217 238 238    Basename 04/10/12 0640 2012-04-22 0628 04/08/12 0635  NA 134* 134* 131*  K 3.4* 3.6 3.7  CL 94* 97 97  CO2 33* 25 26  BUN 4* 7 9  CREATININE 0.74 0.71 0.73  GLUCOSE 101* 112* 137*  CALCIUM 8.8 8.5 8.2*   Lab Results  Component Value Date   INR 0.90 03/30/2012   INR 0.89 11/25/2010   INR 1.80* 04-22-2010     Recent Radiographic Studies :   Chest 2 View  03/30/2012  *RADIOLOGY REPORT*  Clinical Data: Preop radiograph.   Left knee surgery.  CHEST - 2 VIEW  Comparison: 03/29/2010  Findings: The heart size and mediastinal contours are within normal limits.  Both lungs are clear.  The visualized skeletal structures are unremarkable.  IMPRESSION: Negative exam.  Original Report Authenticated By: Rosealee Albee, M.D.    DISCHARGE INSTRUCTIONS: Discharge Orders    Future Orders Please Complete By Expires   Diet general      Call MD / Call 911      Comments:   If you experience chest pain or shortness of breath, CALL 911 and be transported to the hospital emergency room.  If you develope a fever above 101 F, pus (white drainage) or increased drainage or redness at the wound, or calf pain, call your surgeon's office.   Constipation Prevention      Comments:   Drink plenty of fluids.  Prune juice may be helpful.  You may use a stool softener, such as Colace (over the counter) 100 mg twice a day.  Use MiraLax (over the counter) for constipation as needed.   Increase activity slowly as tolerated      Weight Bearing as taught in Physical Therapy      Comments:   Use a walker or crutches as instructed in Physical Therapy (PT) 50% weight bearing   Patient may shower      Comments:   You may shower without a dressing once there is no drainage.  Do not wash over the wound.  If drainage remains, cover wound with plastic wrap and then shower.   Driving restrictions      Comments:   No driving for 6 weeks   Lifting restrictions      Comments:   No lifting for 6 weeks   CPM      Comments:   Continuous passive motion machine (CPM):      Use the CPM from 0 to 60 for 8 hours per day.      You may increase by 5-10 per day.  You may break it up into 2 or 3 sessions per day.      Use CPM for 3-4 weeks or until you are told to stop.   TED hose      Comments:   Use stockings (TED hose) for 4 weeks on both leg(s).  You may remove them at night for sleeping.   Change dressing      Comments:   Change dressing on Saturday,  then change the dressing daily with sterile 4  x 4 inch gauze dressing and apply TED hose.  You may clean the incision with alcohol prior to redressing.   Do not put a pillow under the knee. Place it under the heel.         DISCHARGE MEDICATIONS:   Medication List  As of 04/10/2012  8:40 AM   STOP taking these medications         diclofenac 75 MG EC tablet         TAKE these medications         amLODipine 10 MG tablet   Commonly known as: NORVASC   Take 10 mg by mouth daily.      DULoxetine 60 MG capsule   Commonly known as: CYMBALTA   Take 60 mg by mouth daily.      esomeprazole 40 MG capsule   Commonly known as: NEXIUM   Take 40 mg by mouth daily before breakfast.      estradiol 0.025 mg/24hr   Commonly known as: CLIMARA - Dosed in mg/24 hr   Place 1 patch onto the skin once a week.      hydrochlorothiazide 25 MG tablet   Commonly known as: HYDRODIURIL   Take 25 mg by mouth daily.      methocarbamol 500 MG tablet   Commonly known as: ROBAXIN   Take 1 tablet (500 mg total) by mouth every 6 (six) hours as needed (spasms).      mulitivitamin with minerals Tabs   Take 1 tablet by mouth daily.      oxyCODONE 5 MG immediate release tablet   Commonly known as: Oxy IR/ROXICODONE   Take 1-2 tablets (5-10 mg total) by mouth every 4 (four) hours as needed for pain.      rivaroxaban 10 MG Tabs tablet   Commonly known as: XARELTO   Take 1 tablet (10 mg total) by mouth at bedtime.      traMADol 50 MG tablet   Commonly known as: ULTRAM   Take 50 mg by mouth every 6 (six) hours as needed. For pain      VITAMIN D PO   Take 1 tablet by mouth daily.            FOLLOW UP VISIT:   Follow-up Information    Follow up with Valeria Batman, MD on 04/20/2012.   Contact information:   [address] [place] [address] [phone]          DISPOSITION:  Home    CONDITION:  Stable   Jalyn Rosero 04/10/2012, 8:40 AM

## 2012-04-10 NOTE — Progress Notes (Signed)
Agree with treatment session.  04/10/2012 Cephus Shelling, PT, DPT [phone]

## 2012-08-12 ENCOUNTER — Other Ambulatory Visit: Payer: Self-pay | Admitting: Orthopaedic Surgery

## 2012-08-12 ENCOUNTER — Ambulatory Visit
Admission: RE | Admit: 2012-08-12 | Discharge: 2012-08-12 | Disposition: A | Discharge status: Home / Self Care | Payer: BC Managed Care – PPO | Source: Ambulatory Visit | Attending: Orthopaedic Surgery | Admitting: Orthopaedic Surgery

## 2012-08-12 DIAGNOSIS — R609 Edema, unspecified: Secondary | ICD-10-CM

## 2012-08-12 DIAGNOSIS — R52 Pain, unspecified: Secondary | ICD-10-CM

## 2012-09-12 ENCOUNTER — Encounter (HOSPITAL_BASED_OUTPATIENT_CLINIC_OR_DEPARTMENT_OTHER): Payer: Self-pay | Admitting: *Deleted

## 2012-09-12 ENCOUNTER — Emergency Department (HOSPITAL_BASED_OUTPATIENT_CLINIC_OR_DEPARTMENT_OTHER)
Admission: EM | Admit: 2012-09-12 | Discharge: 2012-09-12 | Disposition: A | Discharge status: Home / Self Care | Payer: BC Managed Care – PPO | Attending: Emergency Medicine | Admitting: Emergency Medicine

## 2012-09-12 ENCOUNTER — Emergency Department (HOSPITAL_BASED_OUTPATIENT_CLINIC_OR_DEPARTMENT_OTHER): Payer: BC Managed Care – PPO

## 2012-09-12 DIAGNOSIS — M79662 Pain in left lower leg: Secondary | ICD-10-CM

## 2012-09-12 DIAGNOSIS — J45909 Unspecified asthma, uncomplicated: Secondary | ICD-10-CM | POA: Insufficient documentation

## 2012-09-12 DIAGNOSIS — M79609 Pain in unspecified limb: Secondary | ICD-10-CM | POA: Insufficient documentation

## 2012-09-12 DIAGNOSIS — IMO0001 Reserved for inherently not codable concepts without codable children: Secondary | ICD-10-CM | POA: Insufficient documentation

## 2012-09-12 DIAGNOSIS — K219 Gastro-esophageal reflux disease without esophagitis: Secondary | ICD-10-CM | POA: Insufficient documentation

## 2012-09-12 DIAGNOSIS — Z79899 Other long term (current) drug therapy: Secondary | ICD-10-CM | POA: Insufficient documentation

## 2012-09-12 DIAGNOSIS — I82412 Acute embolism and thrombosis of left femoral vein: Secondary | ICD-10-CM

## 2012-09-12 DIAGNOSIS — Z86718 Personal history of other venous thrombosis and embolism: Secondary | ICD-10-CM | POA: Insufficient documentation

## 2012-09-12 DIAGNOSIS — I10 Essential (primary) hypertension: Secondary | ICD-10-CM | POA: Insufficient documentation

## 2012-09-12 DIAGNOSIS — I824Y9 Acute embolism and thrombosis of unspecified deep veins of unspecified proximal lower extremity: Secondary | ICD-10-CM | POA: Insufficient documentation

## 2012-09-12 DIAGNOSIS — Z7982 Long term (current) use of aspirin: Secondary | ICD-10-CM | POA: Insufficient documentation

## 2012-09-12 DIAGNOSIS — M129 Arthropathy, unspecified: Secondary | ICD-10-CM | POA: Insufficient documentation

## 2012-09-12 LAB — CBC WITH DIFFERENTIAL/PLATELET
Eosinophils Relative: 3 % (ref 0–5)
HCT: 37.8 % (ref 36.0–46.0)
Lymphocytes Relative: 47 % — ABNORMAL HIGH (ref 12–46)
Lymphs Abs: 1.7 10*3/uL (ref 0.7–4.0)
MCV: 80.8 fL (ref 78.0–100.0)
Monocytes Absolute: 0.5 10*3/uL (ref 0.1–1.0)
RBC: 4.68 MIL/uL (ref 3.87–5.11)
WBC: 3.7 10*3/uL — ABNORMAL LOW (ref 4.0–10.5)

## 2012-09-12 LAB — BASIC METABOLIC PANEL
CO2: 28 mEq/L (ref 19–32)
Calcium: 10 mg/dL (ref 8.4–10.5)
Creatinine, Ser: 0.9 mg/dL (ref 0.50–1.10)
Glucose, Bld: 84 mg/dL (ref 70–99)

## 2012-09-12 LAB — URINALYSIS, ROUTINE W REFLEX MICROSCOPIC
Bilirubin Urine: NEGATIVE
Hgb urine dipstick: NEGATIVE
Protein, ur: NEGATIVE mg/dL
Urobilinogen, UA: 0.2 mg/dL (ref 0.0–1.0)

## 2012-09-12 LAB — URINE MICROSCOPIC-ADD ON

## 2012-09-12 MED ORDER — OXYCODONE-ACETAMINOPHEN 5-325 MG PO TABS: 1.0000 | ORAL_TABLET | ORAL | Status: DC | PRN

## 2012-09-12 MED ORDER — ENOXAPARIN SODIUM 100 MG/ML ~~LOC~~ SOLN
1.0000 mg/kg | Freq: Once | SUBCUTANEOUS | Status: AC
  Administered 2012-09-12: 95 mg via SUBCUTANEOUS
  Filled 2012-09-12: qty 1

## 2012-09-12 MED ORDER — OXYCODONE-ACETAMINOPHEN 5-325 MG PO TABS
1.0000 | ORAL_TABLET | Freq: Once | ORAL | Status: AC
  Administered 2012-09-12: 1 via ORAL
  Filled 2012-09-12 (×2): qty 1

## 2012-09-12 MED ORDER — RIVAROXABAN 15 MG PO TABS: 15.0000 mg | ORAL_TABLET | Freq: Every day | ORAL | Status: DC

## 2012-09-12 NOTE — ED Notes (Signed)
Pt states she had a knee replacement in May. Began having pain in her left calf 2 weeks ago. Seen at Dr. Isidore Moos and ultrasound was done.No clots in main arteries. Pain continued. Pt spoke with her Dr. And he told her her potassium may be low and to come to the office next week, but pain has increased.

## 2012-09-12 NOTE — ED Provider Notes (Addendum)
History   This chart was scribed for Alexandria Booze, MD by Toya Smothers. The patient was seen in room MH01/MH01. Patient's care was started at 1730.  CSN: [id number]  Arrival date & time 09/12/12  1730   First MD Initiated Contact with Patient 09/12/12 1939      Chief Complaint  Patient presents with  . Leg Pain   The history is provided by the patient. No language interpreter was used.    Alexandria [name] is a 41 y.o. female with a h/o joint replacement, total knee arthroplasty, GERD, fibromyalgia, blood clots, and HTN who presents to the ED complaining of 3 weeks of gradual onset severe worsening left calf pain. Pain is 8/10, aggravated with ambulation, and alleviated by nothing. PTA Pt has taken Methocarbamol and Tramadol with mild relief. Denies back pain, chest pain, numbness, weakness, tingling, and  fever.  Pt lists Surgeon as Lewayne Bunting.   Past Medical History  Diagnosis Date  . Hypertension   . Asthma   . Blood transfusion 2011    with right tka  . GERD (gastroesophageal reflux disease)   . Arthritis   . Fibromyalgia   . H/O blood clots 2011-2012    post op clots calf right leg..     Past Surgical History  Procedure Date  . Joint replacement 2011    right knee  . Gastric bypass 2012    sleeve bypass  . Abdominal hysterectomy   . Tubal ligation   . Total knee arthroplasty 04/07/2012    Procedure: TOTAL KNEE ARTHROPLASTY;  Surgeon: Valeria Batman, MD;  Location: [facility] OR;  Service: Orthopedics;  Laterality: Left;    Family History  Problem Relation Age of Onset  . Anesthesia problems Neg Hx     History  Substance Use Topics  . Smoking status: Not on file  . Smokeless tobacco: Not on file  . Alcohol Use: No   Review of Systems  Musculoskeletal: Negative for back pain.       Extremity pain  All other systems reviewed and are negative.    Allergies  Review of patient's allergies indicates no known allergies.  Home Medications   Current  Outpatient Rx  Name Route Sig Dispense Refill  . ASPIRIN 81 MG PO TABS Oral Take 81 mg by mouth daily.    Marland Kitchen DICLOFENAC SODIUM 75 MG PO TBEC Oral Take 75 mg by mouth 2 (two) times daily.    Marland Kitchen AMLODIPINE BESYLATE 10 MG PO TABS Oral Take 10 mg by mouth daily.    Marland Kitchen VITAMIN D PO Oral Take 1 tablet by mouth daily.    . DULOXETINE HCL 60 MG PO CPEP Oral Take 60 mg by mouth daily.    Marland Kitchen ESOMEPRAZOLE MAGNESIUM 40 MG PO CPDR Oral Take 40 mg by mouth daily before breakfast.    . ESTRADIOL 0.025 MG/24HR TD PTWK Transdermal Place 1 patch onto the skin once a week.    Marland Kitchen HYDROCHLOROTHIAZIDE 25 MG PO TABS Oral Take 25 mg by mouth daily.    . ADULT MULTIVITAMIN W/MINERALS CH Oral Take 1 tablet by mouth daily.    Marland Kitchen RIVAROXABAN 10 MG PO TABS Oral Take 1 tablet (10 mg total) by mouth at bedtime. 31 tablet 0  . TRAMADOL HCL 50 MG PO TABS Oral Take 50 mg by mouth every 6 (six) hours as needed. For pain      BP 124/86  Pulse 90  Temp 98.7 F (37.1 C) (Oral)  Resp 20  Ht  5\' 5"  (1.651 m)  Wt 208 lb 7 oz (94.547 kg)  BMI 34.69 kg/m2  SpO2 100%  Physical Exam  Constitutional: She is oriented to person, place, and time. She appears well-developed and well-nourished. No distress.  HENT:  Head: Normocephalic and atraumatic.  Mouth/Throat: No oropharyngeal exudate.  Eyes: EOM are normal. Pupils are equal, round, and reactive to light. No scleral icterus.  Neck: Normal range of motion. Neck supple.  Cardiovascular: Normal rate, regular rhythm and normal heart sounds.   No murmur heard. Pulmonary/Chest: Effort normal and breath sounds normal. No respiratory distress.  Abdominal: Bowel sounds are normal. There is no tenderness.  Musculoskeletal:       Surgical incision are preset over both knee he. Moderate effusion left knee. Left calf is 0.5 cm greater in diameter than right calf. Mild tenderness mid left calf. Marked tenderness proximal left calf.  Lymphadenopathy:    She has no cervical adenopathy.    Neurological: She is alert and oriented to person, place, and time. Coordination normal.  Skin: Skin is warm and dry. No rash noted.  Psychiatric: She has a normal mood and affect. Her behavior is normal. Judgment and thought content normal.    ED Course  Procedures DIAGNOSTIC STUDIES: Oxygen Saturation is 100% on room air, normal by my interpretation.    COORDINATION OF CARE: 19:43- Evaluated Pt. Pt is awake, alert, and oriented. 19:49- Patient informed of clinical course, understand medical decision-making process, and agree with plan.    Results for orders placed during the hospital encounter of 09/12/12  URINALYSIS, ROUTINE W REFLEX MICROSCOPIC      Component Value Range   Color, Urine YELLOW  YELLOW   APPearance CLOUDY (*) CLEAR   Specific Gravity, Urine 1.019  1.005 - 1.030   pH 6.0  5.0 - 8.0   Glucose, UA NEGATIVE  NEGATIVE mg/dL   Hgb urine dipstick NEGATIVE  NEGATIVE   Bilirubin Urine NEGATIVE  NEGATIVE   Ketones, ur NEGATIVE  NEGATIVE mg/dL   Protein, ur NEGATIVE  NEGATIVE mg/dL   Urobilinogen, UA 0.2  0.0 - 1.0 mg/dL   Nitrite NEGATIVE  NEGATIVE   Leukocytes, UA SMALL (*) NEGATIVE  URINE MICROSCOPIC-ADD ON      Component Value Range   Squamous Epithelial / LPF FEW (*) RARE   WBC, UA 3-6  <3 WBC/hpf   Bacteria, UA MANY (*) RARE   Urine-Other MUCOUS PRESENT    CBC WITH DIFFERENTIAL      Component Value Range   WBC 3.7 (*) 4.0 - 10.5 K/uL   RBC 4.68  3.87 - 5.11 MIL/uL   Hemoglobin 12.3  12.0 - 15.0 g/dL   HCT 11.9  14.7 - 82.9 %   MCV 80.8  78.0 - 100.0 fL   MCH 26.3  26.0 - 34.0 pg   MCHC 32.5  30.0 - 36.0 g/dL   RDW 56.2  13.0 - 86.5 %   Platelets 322  150 - 400 K/uL   Neutrophils Relative 38 (*) 43 - 77 %   Neutro Abs 1.4 (*) 1.7 - 7.7 K/uL   Lymphocytes Relative 47 (*) 12 - 46 %   Lymphs Abs 1.7  0.7 - 4.0 K/uL   Monocytes Relative 12  3 - 12 %   Monocytes Absolute 0.5  0.1 - 1.0 K/uL   Eosinophils Relative 3  0 - 5 %   Eosinophils Absolute 0.1   0.0 - 0.7 K/uL   Basophils Relative 1  0 - 1 %  Basophils Absolute 0.0  0.0 - 0.1 K/uL  BASIC METABOLIC PANEL      Component Value Range   Sodium 139  135 - 145 mEq/L   Potassium 3.9  3.5 - 5.1 mEq/L   Chloride 101  96 - 112 mEq/L   CO2 28  19 - 32 mEq/L   Glucose, Bld 84  70 - 99 mg/dL   BUN 15  6 - 23 mg/dL   Creatinine, Ser 1.61  0.50 - 1.10 mg/dL   Calcium 09.6  8.4 - 04.5 mg/dL   GFR calc non Af Amer 77 (*) >90 mL/min   GFR calc Af Amer 89 (*) >90 mL/min     1. Pain of left calf       MDM  Left calf pain which seems more muscular than anything else. She is tender over the insertion of the gastrocnemius and soleus muscles. [name]'s cyst could cause similar pain, but this is unlikely an immediate postoperative period. Prior records are reviewed, and she had a Doppler of her leg done on  September 4. She's had a Doppler test since then. Doppler test will be set up for tomorrow. She was concerned about possible hypokalemia so we'll try to be checked tonight. She's given a dose of Percocet for pain.  She got good pain relief with Percocet. Potassium is come back normal. She is to return tomorrow for a venous Doppler and she is sent home with prescription for Percocet. She will need to followup with her orthopedic doctor regarding her knee replacement.   I personally performed the services described in this documentation, which was scribed in my presence. The recorded information has been reviewed and considered.     Alexandria Booze, MD 09/12/12 2051  Ultrasound technician was actually available and Doppler test was done which didn't show evidence of DVT. Treatment options were explained and the patient stated she preferred going back and right proximal and. Treatment for DVT with thyroxine is 4:15 milligrams twice a day for 21 days, then 20 mg once a day. She's given a prescription for the 50 mg dose for as the first 21 days. Her PCP will need to monitor her treatment and prescribed  medication following that.  US Venous Img Lower Unilateral Left  09/12/2012  *RADIOLOGY REPORT*  Clinical Data: Leg pain.  Bilateral knee replacement surgery.  LEFT LOWER EXTREMITY VENOUS DOPPLER ULTRASOUND  Technique: Gray-scale sonography with compression, as well as color and duplex ultrasound, were performed to evaluate the deep venous system from the level of the common femoral vein through the popliteal and proximal calf veins.  Comparison: 08/12/2012  Findings: There is incomplete compressibility of the distal superficial femoral vein consistent with DVT.  There is normal color Doppler signal through the remainder the lumen with normal wave forms and augmentation.  The popliteal vein, profunda femoral vein, and common femoral vein show normal compressibility, phasicity, and augmentation.  IMPRESSION: 1.  Partially occlusive thrombus in the distal superficial femoral vein.   Original Report Authenticated By: Osa Craver, M.D.       Alexandria Booze, MD 09/12/12 2223

## 2013-06-25 ENCOUNTER — Emergency Department (HOSPITAL_BASED_OUTPATIENT_CLINIC_OR_DEPARTMENT_OTHER)
Admission: EM | Admit: 2013-06-25 | Discharge: 2013-06-25 | Disposition: A | Discharge status: Home / Self Care | Payer: BC Managed Care – PPO | Attending: Emergency Medicine | Admitting: Emergency Medicine

## 2013-06-25 ENCOUNTER — Other Ambulatory Visit: Payer: Self-pay

## 2013-06-25 ENCOUNTER — Encounter (HOSPITAL_BASED_OUTPATIENT_CLINIC_OR_DEPARTMENT_OTHER): Payer: Self-pay | Admitting: *Deleted

## 2013-06-25 DIAGNOSIS — Z9884 Bariatric surgery status: Secondary | ICD-10-CM | POA: Insufficient documentation

## 2013-06-25 DIAGNOSIS — E876 Hypokalemia: Secondary | ICD-10-CM | POA: Insufficient documentation

## 2013-06-25 DIAGNOSIS — IMO0001 Reserved for inherently not codable concepts without codable children: Secondary | ICD-10-CM | POA: Insufficient documentation

## 2013-06-25 DIAGNOSIS — I10 Essential (primary) hypertension: Secondary | ICD-10-CM | POA: Insufficient documentation

## 2013-06-25 DIAGNOSIS — M129 Arthropathy, unspecified: Secondary | ICD-10-CM | POA: Insufficient documentation

## 2013-06-25 DIAGNOSIS — Z8679 Personal history of other diseases of the circulatory system: Secondary | ICD-10-CM | POA: Insufficient documentation

## 2013-06-25 DIAGNOSIS — R42 Dizziness and giddiness: Secondary | ICD-10-CM | POA: Insufficient documentation

## 2013-06-25 DIAGNOSIS — K219 Gastro-esophageal reflux disease without esophagitis: Secondary | ICD-10-CM | POA: Insufficient documentation

## 2013-06-25 DIAGNOSIS — J45909 Unspecified asthma, uncomplicated: Secondary | ICD-10-CM | POA: Insufficient documentation

## 2013-06-25 DIAGNOSIS — Z79899 Other long term (current) drug therapy: Secondary | ICD-10-CM | POA: Insufficient documentation

## 2013-06-25 DIAGNOSIS — Z7982 Long term (current) use of aspirin: Secondary | ICD-10-CM | POA: Insufficient documentation

## 2013-06-25 LAB — CBC WITH DIFFERENTIAL/PLATELET
Eosinophils Absolute: 0.1 10*3/uL (ref 0.0–0.7)
Eosinophils Relative: 1 % (ref 0–5)
Lymphs Abs: 2.5 10*3/uL (ref 0.7–4.0)
MCH: 26.9 pg (ref 26.0–34.0)
MCV: 82.9 fL (ref 78.0–100.0)
Platelets: 313 10*3/uL (ref 150–400)
RBC: 5.1 MIL/uL (ref 3.87–5.11)
RDW: 13.3 % (ref 11.5–15.5)

## 2013-06-25 LAB — BASIC METABOLIC PANEL
CO2: 27 mEq/L (ref 19–32)
Chloride: 94 mEq/L — ABNORMAL LOW (ref 96–112)
GFR calc Af Amer: 89 mL/min — ABNORMAL LOW (ref >90)
Glucose, Bld: 149 mg/dL — ABNORMAL HIGH (ref 70–99)
Potassium: 3 mEq/L — ABNORMAL LOW (ref 3.5–5.1)

## 2013-06-25 MED ORDER — DIAZEPAM 5 MG/ML IJ SOLN
5.0000 mg | Freq: Once | INTRAMUSCULAR | Status: AC
  Administered 2013-06-25: 5 mg via INTRAVENOUS
  Filled 2013-06-25: qty 2

## 2013-06-25 MED ORDER — SODIUM CHLORIDE 0.9 % IV BOLUS (SEPSIS)
500.0000 mL | Freq: Once | INTRAVENOUS | Status: AC
  Administered 2013-06-25: 500 mL via INTRAVENOUS

## 2013-06-25 MED ORDER — DIAZEPAM 5 MG PO TABS
5.0000 mg | ORAL_TABLET | Freq: Two times a day (BID) | ORAL | Status: DC | PRN
Start: 2013-06-25 — End: 2016-09-18

## 2013-06-25 MED ORDER — POTASSIUM CHLORIDE CRYS ER 20 MEQ PO TBCR
40.0000 meq | EXTENDED_RELEASE_TABLET | Freq: Once | ORAL | Status: AC
  Administered 2013-06-25: 40 meq via ORAL
  Filled 2013-06-25: qty 2

## 2013-06-25 NOTE — ED Notes (Signed)
Pt amb to room 10 with steady gait, reports feeling dizzy x 7/4, seen by regional physicians and dx with vertigo, pt states she also had a negative ct scan last Tuesday, cont with dizzyness. Pt states that "the room was spinning this morning.." pt states she has not been taking the meclizine because it makes her sleepy.

## 2013-06-25 NOTE — ED Provider Notes (Signed)
History    CSN: [id number] Arrival date & time 06/25/13  1026  First MD Initiated Contact with Patient 06/25/13 1046     Chief Complaint  Patient presents with  . Dizziness   (Consider location/radiation/quality/duration/timing/severity/associated sxs/prior Treatment) HPI Comments: Patient comes to the ER for evaluation of dizziness. Patient reports that she has been dizzy for 2 weeks. She has seen her doctor and was told that she had vertigo. She had head CT performed one week ago which was reportedly normal. She was started on meclizine but feels like it makes it worse. She followup at ENT and was told that the evaluation there was normal. She was given a corticosteroid shot but has not improved. Patient reports persistent dizziness, feeling like he is spinning. It is related to movements but also now even there when she closes her eyes. She has started to notice a little buzzing noise in her ears. There is no hearing loss.  Past Medical History  Diagnosis Date  . Hypertension   . Asthma   . Blood transfusion 2011    with right tka  . GERD (gastroesophageal reflux disease)   . Arthritis   . Fibromyalgia   . H/O blood clots 2011-2012    post op clots calf right leg..    Past Surgical History  Procedure Laterality Date  . Joint replacement  2011    right knee  . Gastric bypass  2012    sleeve bypass  . Abdominal hysterectomy    . Tubal ligation    . Total knee arthroplasty  04/07/2012    Procedure: TOTAL KNEE ARTHROPLASTY;  Surgeon: Valeria Batman, MD;  Location: [facility] OR;  Service: Orthopedics;  Laterality: Left;   Family History  Problem Relation Age of Onset  . Anesthesia problems Neg Hx    History  Substance Use Topics  . Smoking status: Not on file  . Smokeless tobacco: Not on file  . Alcohol Use: No   OB History   Grav Para Term Preterm Abortions TAB SAB Ect Mult Living                 Review of Systems  Respiratory: Negative.   Cardiovascular: Negative.    Neurological: Positive for dizziness.  All other systems reviewed and are negative.    Allergies  Review of patient's allergies indicates no known allergies.  Home Medications   Current Outpatient Rx  Name  Route  Sig  Dispense  Refill  . amLODipine (NORVASC) 10 MG tablet   Oral   Take 10 mg by mouth daily.         Marland Kitchen aspirin 81 MG tablet   Oral   Take 81 mg by mouth daily.         . Cholecalciferol (VITAMIN D PO)   Oral   Take 1 tablet by mouth daily.         . diclofenac (VOLTAREN) 75 MG EC tablet   Oral   Take 75 mg by mouth 2 (two) times daily.         . DULoxetine (CYMBALTA) 60 MG capsule   Oral   Take 60 mg by mouth daily.         Marland Kitchen esomeprazole (NEXIUM) 40 MG capsule   Oral   Take 40 mg by mouth daily before breakfast.         . estradiol (CLIMARA - DOSED IN MG/24 HR) 0.025 mg/24hr   Transdermal   Place 1 patch onto the skin  once a week.         . hydrochlorothiazide (HYDRODIURIL) 25 MG tablet   Oral   Take 25 mg by mouth daily.         . Multiple Vitamin (MULITIVITAMIN WITH MINERALS) TABS   Oral   Take 1 tablet by mouth daily.         Marland Kitchen oxyCODONE-acetaminophen (PERCOCET/ROXICET) 5-325 MG per tablet   Oral   Take 1 tablet by mouth every 4 (four) hours as needed for pain.   20 tablet   0   . rivaroxaban (XARELTO) 10 MG TABS tablet   Oral   Take 1 tablet (10 mg total) by mouth at bedtime.   31 tablet   0   . Rivaroxaban (XARELTO) 15 MG TABS tablet   Oral   Take 1 tablet (15 mg total) by mouth daily.   42 tablet   0   . traMADol (ULTRAM) 50 MG tablet   Oral   Take 50 mg by mouth every 6 (six) hours as needed. For pain          BP 126/91  Pulse 70  Temp(Src) 98.5 F (36.9 C) (Oral)  Resp 18  Ht 5\' 6"  (1.676 m)  Wt 208 lb (94.348 kg)  BMI 33.59 kg/m2  SpO2 100% Physical Exam  Constitutional: She is oriented to person, place, and time. She appears well-developed and well-nourished. No distress.  HENT:  Head:  Normocephalic and atraumatic.  Right Ear: Hearing, tympanic membrane and ear canal normal.  Left Ear: Hearing, tympanic membrane and ear canal normal.  Nose: Nose normal.  Mouth/Throat: Oropharynx is clear and moist and mucous membranes are normal.  Eyes: Conjunctivae and EOM are normal. Pupils are equal, round, and reactive to light.  Neck: Normal range of motion. Neck supple.  Cardiovascular: Regular rhythm, S1 normal and S2 normal.  Exam reveals no gallop and no friction rub.   No murmur heard. Pulmonary/Chest: Effort normal and breath sounds normal. No respiratory distress. She exhibits no tenderness.  Abdominal: Soft. Normal appearance and bowel sounds are normal. There is no hepatosplenomegaly. There is no tenderness. There is no rebound, no guarding, no tenderness at McBurney's point and negative Murphy's sign. No hernia.  Musculoskeletal: Normal range of motion.  Neurological: She is alert and oriented to person, place, and time. She has normal strength. No cranial nerve deficit or sensory deficit. Coordination normal. GCS eye subscore is 4. GCS verbal subscore is 5. GCS motor subscore is 6.  Skin: Skin is warm, dry and intact. No rash noted. No cyanosis.  Psychiatric: She has a normal mood and affect. Her speech is normal and behavior is normal. Thought content normal.    ED Course  Procedures (including critical care time)  EKG:  Date: 06/25/2013  Rate: 63  Rhythm: normal sinus rhythm  QRS Axis: normal  Intervals: normal  ST/T Wave abnormalities: nonspecific T wave changes  Conduction Disutrbances:none  Narrative Interpretation:   Old EKG Reviewed: unchanged   Labs Reviewed  CBC WITH DIFFERENTIAL - Abnormal; Notable for the following:    Neutrophils Relative % 42 (*)    Lymphocytes Relative 49 (*)    All other components within normal limits  BASIC METABOLIC PANEL - Abnormal; Notable for the following:    Potassium 3.0 (*)    Chloride 94 (*)    Glucose, Bld 149 (*)     GFR calc non Af Amer 77 (*)    GFR calc Af Amer 89 (*)  All other components within normal limits   No results found.  Diagnosis: Vertigo  MDM  Patient presents to ER with positional vertigo for 2 weeks. She has been evaluated by her doctor, had a head CT and also seen ENT. Patient reports that her symptoms are worsening and the Antivert has not helped. I did not feel repeat imaging was necessary. Blood work was normal other than mild hypokalemia. Patient given potassium here, will increase potassium intake. Patient given fluids and Valium if symptoms are significantly improved. No dizziness currently. This is very consistent with peripheral vertigo, patient to be discharged. Patient does report some edging in her years. I did discuss with her this is possibly Mnire's disease and she has continued symptoms or worsening ringing, hearing loss, needs to go back to her ENT doctor.   Gilda Crease, MD 06/25/13 1236

## 2014-07-10 ENCOUNTER — Encounter (HOSPITAL_BASED_OUTPATIENT_CLINIC_OR_DEPARTMENT_OTHER): Payer: Self-pay | Admitting: Emergency Medicine

## 2014-07-10 ENCOUNTER — Emergency Department (HOSPITAL_BASED_OUTPATIENT_CLINIC_OR_DEPARTMENT_OTHER)
Admission: EM | Admit: 2014-07-10 | Discharge: 2014-07-10 | Disposition: A | Discharge status: Home / Self Care | Payer: BC Managed Care – PPO | Attending: Emergency Medicine | Admitting: Emergency Medicine

## 2014-07-10 DIAGNOSIS — J45909 Unspecified asthma, uncomplicated: Secondary | ICD-10-CM | POA: Insufficient documentation

## 2014-07-10 DIAGNOSIS — M129 Arthropathy, unspecified: Secondary | ICD-10-CM | POA: Insufficient documentation

## 2014-07-10 DIAGNOSIS — R519 Headache, unspecified: Secondary | ICD-10-CM

## 2014-07-10 DIAGNOSIS — Z7982 Long term (current) use of aspirin: Secondary | ICD-10-CM | POA: Insufficient documentation

## 2014-07-10 DIAGNOSIS — R51 Headache: Secondary | ICD-10-CM | POA: Insufficient documentation

## 2014-07-10 DIAGNOSIS — K219 Gastro-esophageal reflux disease without esophagitis: Secondary | ICD-10-CM | POA: Insufficient documentation

## 2014-07-10 DIAGNOSIS — R110 Nausea: Secondary | ICD-10-CM | POA: Insufficient documentation

## 2014-07-10 DIAGNOSIS — I10 Essential (primary) hypertension: Secondary | ICD-10-CM | POA: Insufficient documentation

## 2014-07-10 MED ORDER — SODIUM CHLORIDE 0.9 % IV BOLUS (SEPSIS)
1000.0000 mL | INTRAVENOUS | Status: AC
  Administered 2014-07-10: 1000 mL via INTRAVENOUS

## 2014-07-10 MED ORDER — METOCLOPRAMIDE HCL 5 MG/ML IJ SOLN
5.0000 mg | Freq: Once | INTRAMUSCULAR | Status: AC
  Administered 2014-07-10: 5 mg via INTRAVENOUS
  Filled 2014-07-10: qty 2

## 2014-07-10 MED ORDER — DIPHENHYDRAMINE HCL 50 MG/ML IJ SOLN
25.0000 mg | Freq: Once | INTRAMUSCULAR | Status: AC
  Administered 2014-07-10: 25 mg via INTRAVENOUS
  Filled 2014-07-10: qty 1

## 2014-07-10 MED ORDER — DEXAMETHASONE SODIUM PHOSPHATE 10 MG/ML IJ SOLN
10.0000 mg | Freq: Once | INTRAMUSCULAR | Status: AC
  Administered 2014-07-10: 10 mg via INTRAVENOUS
  Filled 2014-07-10: qty 1

## 2014-07-10 NOTE — ED Provider Notes (Signed)
CSN: [id number]     Arrival date & time 07/10/14  8295 History   First MD Initiated Contact with Patient 07/10/14 [id number]     Chief Complaint  Patient presents with  . Headache     (Consider location/radiation/quality/duration/timing/severity/associated sxs/prior Treatment) Patient is a 43 y.o. female presenting with headaches. The history is provided by the patient.  Headache Pain location:  R parietal and R temporal Quality: pressure. Radiates to:  Does not radiate Severity currently:  8/10 Severity at highest:  10/10 Onset quality:  Gradual Duration:  1 day Timing:  Constant Progression:  Improving Chronicity:  New Similar to prior headaches: no   Context comment:  Riding in a car Relieved by:  Nothing Worsened by:  Nothing tried Ineffective treatments: tramadol, allergy pill. Associated symptoms: nausea (not today)   Associated symptoms: no abdominal pain, no back pain, no congestion, no cough, no diarrhea, no dizziness, no pain, no fatigue, no fever, no neck pain and no vomiting     Past Medical History  Diagnosis Date  . Hypertension   . Asthma   . Blood transfusion 2011    with right tka  . GERD (gastroesophageal reflux disease)   . Arthritis   . Fibromyalgia   . H/O blood clots 2011-2012    post op clots calf right leg..    Past Surgical History  Procedure Laterality Date  . Joint replacement  2011    right knee  . Gastric bypass  2012    sleeve bypass  . Abdominal hysterectomy    . Tubal ligation    . Total knee arthroplasty  04/07/2012    Procedure: TOTAL KNEE ARTHROPLASTY;  Surgeon: Valeria Batman, MD;  Location: [facility] OR;  Service: Orthopedics;  Laterality: Left;   Family History  Problem Relation Age of Onset  . Anesthesia problems Neg Hx    History  Substance Use Topics  . Smoking status: Never Smoker   . Smokeless tobacco: Not on file  . Alcohol Use: No   OB History   Grav Para Term Preterm Abortions TAB SAB Ect Mult Living                  Review of Systems  Constitutional: Negative for fever and fatigue.  HENT: Negative for congestion and drooling.   Eyes: Negative for pain.  Respiratory: Negative for cough and shortness of breath.   Cardiovascular: Negative for chest pain.  Gastrointestinal: Positive for nausea (not today). Negative for vomiting, abdominal pain and diarrhea.  Genitourinary: Negative for dysuria and hematuria.  Musculoskeletal: Negative for back pain, gait problem and neck pain.  Skin: Negative for color change.  Neurological: Positive for headaches. Negative for dizziness.  Hematological: Negative for adenopathy.  Psychiatric/Behavioral: Negative for behavioral problems.  All other systems reviewed and are negative.     Allergies  Morphine and related  Home Medications   Prior to Admission medications   Medication Sig Start Date End Date Taking? Authorizing Provider  magnesium chloride (SLOW-MAG) 64 MG TBEC SR tablet Take by mouth.   Yes Historical Provider, MD  Naltrexone-Bupropion HCl (CONTRAVE PO) Take by mouth.   Yes Historical Provider, MD  Vitamin D, Ergocalciferol, (DRISDOL) 50000 UNITS CAPS capsule Take 50,000 Units by mouth every 7 (seven) days.   Yes Historical Provider, MD  amLODipine (NORVASC) 10 MG tablet Take 10 mg by mouth daily.    Historical Provider, MD  aspirin 81 MG tablet Take 81 mg by mouth daily.    Historical Provider,  MD  Cholecalciferol (VITAMIN D PO) Take 1 tablet by mouth daily.    Historical Provider, MD  diazepam (VALIUM) 5 MG tablet Take 1 tablet (5 mg total) by mouth every 12 (twelve) hours as needed (dizziness). 06/25/13   Gilda Crease, MD  diclofenac (VOLTAREN) 75 MG EC tablet Take 75 mg by mouth 2 (two) times daily.    Historical Provider, MD  DULoxetine (CYMBALTA) 60 MG capsule Take 60 mg by mouth daily.    Historical Provider, MD  esomeprazole (NEXIUM) 40 MG capsule Take 40 mg by mouth daily before breakfast.    Historical Provider, MD  estradiol  (CLIMARA - DOSED IN MG/24 HR) 0.025 mg/24hr Place 1 patch onto the skin once a week.    Historical Provider, MD  hydrochlorothiazide (HYDRODIURIL) 25 MG tablet Take 25 mg by mouth daily.    Historical Provider, MD  Multiple Vitamin (MULITIVITAMIN WITH MINERALS) TABS Take 1 tablet by mouth daily.    Historical Provider, MD  oxyCODONE-acetaminophen (PERCOCET/ROXICET) 5-325 MG per tablet Take 1 tablet by mouth every 4 (four) hours as needed for pain. 09/12/12   Dione Booze, MD  traMADol (ULTRAM) 50 MG tablet Take 50 mg by mouth every 6 (six) hours as needed. For pain    Historical Provider, MD  triamterene-hydrochlorothiazide (MAXZIDE-25) 37.5-25 MG per tablet Take 1 tablet by mouth daily.    Historical Provider, MD   BP 136/89  Pulse 68  Temp(Src) 97.7 F (36.5 C) (Oral)  Resp 18  Ht  (1.676 m)  SpO2 99% Physical Exam  Nursing note and vitals reviewed. Constitutional: She is oriented to person, place, and time. She appears well-developed and well-nourished.  HENT:  Head: Normocephalic.  Mouth/Throat: No oropharyngeal exudate.  Eyes: Conjunctivae and EOM are normal. Pupils are equal, round, and reactive to light.  Neck: Normal range of motion. Neck supple.  Cardiovascular: Normal rate, regular rhythm, normal heart sounds and intact distal pulses.  Exam reveals no gallop and no friction rub.   No murmur heard. Pulmonary/Chest: Effort normal and breath sounds normal. No respiratory distress. She has no wheezes.  Abdominal: Soft. Bowel sounds are normal. There is no tenderness. There is no rebound and no guarding.  Musculoskeletal: Normal range of motion. She exhibits no edema and no tenderness.  Neurological: She is alert and oriented to person, place, and time.  alert, oriented x3 speech: normal in context and clarity memory: intact grossly cranial nerves II-XII: intact motor strength: full proximally and distally no involuntary movements or tremors sensation: intact to light touch  diffusely  cerebellar: finger-to-nose and heel-to-shin intact gait: normal forwards and backwards, normal tandem gait   Skin: Skin is warm and dry.  Psychiatric: She has a normal mood and affect. Her behavior is normal.    ED Course  Procedures (including critical care time) Labs Review Labs Reviewed - No data to display  Imaging Review No results found.   EKG Interpretation None      MDM   Final diagnoses:  Headache, unspecified headache type    9:53 AM 43 y.o. female who presents with a headache. She states that she had a frontal headache 2 days ago which resolved. Yesterday around 3 PM she was riding in a car and developed a gradual onset right-sided headache. She is taking tramadol and an allergy medication without relief. She notes that her headache is currently an 8/10. She does not have a history of headaches. She denies any fevers or dramatic injury to her head.  She is afebrile and vital signs are unremarkable here. She has a normal neurologic exam. Will treat symptomatically with a headache cocktail.  12:38 PM: HA now 2/10.  I have discussed the diagnosis/risks/treatment options with the patient and believe the pt to be eligible for discharge home to follow-up with her pcp as needed. We also discussed returning to the ED immediately if new or worsening sx occur. We discussed the sx which are most concerning (e.g., worsening HA, fever) that necessitate immediate return. Medications administered to the patient during their visit and any new prescriptions provided to the patient are listed below.  Medications given during this visit Medications  sodium chloride 0.9 % bolus 1,000 mL (1,000 mLs Intravenous New Bag/Given 07/10/14 1020)  metoCLOPramide (REGLAN) injection 5 mg (5 mg Intravenous Given 07/10/14 1027)  diphenhydrAMINE (BENADRYL) injection 25 mg (25 mg Intravenous Given 07/10/14 1027)  dexamethasone (DECADRON) injection 10 mg (10 mg Intravenous Given 07/10/14 1027)    New  Prescriptions   No medications on file     Junius Argyle, MD 07/11/14 1702

## 2014-07-10 NOTE — ED Notes (Signed)
Patient reports that she developed frontal headache with radiation to top of head and right side of face and ear since Friday, increasing in intensity last pm, no relief with otc meds, also took tramadol with no relief. No nausea, no blurry vision, denies trauma. Alert and oriented.

## 2015-08-31 ENCOUNTER — Emergency Department (HOSPITAL_BASED_OUTPATIENT_CLINIC_OR_DEPARTMENT_OTHER)
Admission: EM | Admit: 2015-08-31 | Discharge: 2015-08-31 | Disposition: A | Discharge status: Home / Self Care | Payer: 59 | Attending: Emergency Medicine | Admitting: Emergency Medicine

## 2015-08-31 ENCOUNTER — Encounter (HOSPITAL_BASED_OUTPATIENT_CLINIC_OR_DEPARTMENT_OTHER): Payer: Self-pay | Admitting: *Deleted

## 2015-08-31 DIAGNOSIS — Z86718 Personal history of other venous thrombosis and embolism: Secondary | ICD-10-CM | POA: Diagnosis not present

## 2015-08-31 DIAGNOSIS — J45909 Unspecified asthma, uncomplicated: Secondary | ICD-10-CM | POA: Insufficient documentation

## 2015-08-31 DIAGNOSIS — Z7982 Long term (current) use of aspirin: Secondary | ICD-10-CM | POA: Diagnosis not present

## 2015-08-31 DIAGNOSIS — K219 Gastro-esophageal reflux disease without esophagitis: Secondary | ICD-10-CM | POA: Diagnosis not present

## 2015-08-31 DIAGNOSIS — M1990 Unspecified osteoarthritis, unspecified site: Secondary | ICD-10-CM | POA: Insufficient documentation

## 2015-08-31 DIAGNOSIS — I10 Essential (primary) hypertension: Secondary | ICD-10-CM | POA: Diagnosis not present

## 2015-08-31 DIAGNOSIS — J0100 Acute maxillary sinusitis, unspecified: Secondary | ICD-10-CM | POA: Diagnosis not present

## 2015-08-31 DIAGNOSIS — R51 Headache: Secondary | ICD-10-CM | POA: Diagnosis present

## 2015-08-31 DIAGNOSIS — M797 Fibromyalgia: Secondary | ICD-10-CM | POA: Diagnosis not present

## 2015-08-31 DIAGNOSIS — Z79899 Other long term (current) drug therapy: Secondary | ICD-10-CM | POA: Diagnosis not present

## 2015-08-31 MED ORDER — OXYMETAZOLINE HCL 0.05 % NA SOLN
1.0000 | Freq: Once | NASAL | Status: AC
  Administered 2015-08-31: 1 via NASAL
  Filled 2015-08-31: qty 15

## 2015-08-31 MED ORDER — AMOXICILLIN 500 MG PO CAPS: 1000.0000 mg | ORAL_CAPSULE | Freq: Two times a day (BID) | ORAL | Status: DC

## 2015-08-31 NOTE — ED Notes (Addendum)
C/o HA and facial pain, "thinks it is r/t a sinus HA", reports ear ache, sore throat, head and facial pressure (L face > R face), "feel hot & cold", (denies: nvd, known fever, sob, dizziness or other sx), sx onset last Friday, worse today, no relief with benadryl, also takes cetirizine.

## 2015-08-31 NOTE — Discharge Instructions (Signed)
Return to the ED with any concerns including difficulty breathing or swallowing, vomiting and not able to keep down liquids or antibiotics, decreased level of alertness/lethargy, or any other alarming symptoms  You should use the afrin no more than twice daily for the next 3 days

## 2015-08-31 NOTE — ED Notes (Signed)
MD at bedside. 

## 2015-08-31 NOTE — ED Provider Notes (Signed)
CSN: [id number]     Arrival date & time 08/31/15  7829 History   First MD Initiated Contact with Patient 08/31/15 [phone]     Chief Complaint  Patient presents with  . Facial Pain     (Consider location/radiation/quality/duration/timing/severity/associated sxs/prior Treatment) HPI  Pt presenting with nasal and sinus congestion over the past few days.  Associated with left ear pain and left sinus pain.  Has had subjective fever, hot and cold.  Also some sore throat.  No chest pain or coughing.  No difficulty breathing or vomiting.  Has been taking zyrtec and benadryl without much relief. Symptoms are constant.   There are no other associated systemic symptoms, there are no other alleviating or modifying factors.   Past Medical History  Diagnosis Date  . Hypertension   . Asthma   . Blood transfusion 2011    with right tka  . GERD (gastroesophageal reflux disease)   . Arthritis   . Fibromyalgia   . H/O blood clots 2011-2012    post op clots calf right leg..    Past Surgical History  Procedure Laterality Date  . Joint replacement  2011    right knee  . Gastric bypass  2012    sleeve bypass  . Abdominal hysterectomy    . Tubal ligation    . Total knee arthroplasty  04/07/2012    Procedure: TOTAL KNEE ARTHROPLASTY;  Surgeon: Valeria Batman, MD;  Location: [facility] OR;  Service: Orthopedics;  Laterality: Left;   Family History  Problem Relation Age of Onset  . Anesthesia problems Neg Hx    Social History  Substance Use Topics  . Smoking status: Never Smoker   . Smokeless tobacco: None  . Alcohol Use: No   OB History    No data available     Review of Systems  ROS reviewed and all otherwise negative except for mentioned in HPI    Allergies  Morphine and related  Home Medications   Prior to Admission medications   Medication Sig Start Date End Date Taking? Authorizing Provider  amLODipine (NORVASC) 10 MG tablet Take 10 mg by mouth daily.    Historical Provider, MD   amoxicillin (AMOXIL) 500 MG capsule Take 2 capsules (1,000 mg total) by mouth 2 (two) times daily. 08/31/15   Jerelyn Scott, MD  aspirin 81 MG tablet Take 81 mg by mouth daily.    Historical Provider, MD  Cholecalciferol (VITAMIN D PO) Take 1 tablet by mouth daily.    Historical Provider, MD  diazepam (VALIUM) 5 MG tablet Take 1 tablet (5 mg total) by mouth every 12 (twelve) hours as needed (dizziness). 06/25/13   Gilda Crease, MD  diclofenac (VOLTAREN) 75 MG EC tablet Take 75 mg by mouth 2 (two) times daily.    Historical Provider, MD  DULoxetine (CYMBALTA) 60 MG capsule Take 60 mg by mouth daily.    Historical Provider, MD  esomeprazole (NEXIUM) 40 MG capsule Take 40 mg by mouth daily before breakfast.    Historical Provider, MD  estradiol (CLIMARA - DOSED IN MG/24 HR) 0.025 mg/24hr Place 1 patch onto the skin once a week.    Historical Provider, MD  hydrochlorothiazide (HYDRODIURIL) 25 MG tablet Take 25 mg by mouth daily.    Historical Provider, MD  magnesium chloride (SLOW-MAG) 64 MG TBEC SR tablet Take by mouth.    Historical Provider, MD  Multiple Vitamin (MULITIVITAMIN WITH MINERALS) TABS Take 1 tablet by mouth daily.    Historical Provider, MD  Naltrexone-Bupropion HCl (CONTRAVE PO) Take by mouth.    Historical Provider, MD  oxyCODONE-acetaminophen (PERCOCET/ROXICET) 5-325 MG per tablet Take 1 tablet by mouth every 4 (four) hours as needed for pain. 09/12/12   Dione Booze, MD  traMADol (ULTRAM) 50 MG tablet Take 50 mg by mouth every 6 (six) hours as needed. For pain    Historical Provider, MD  triamterene-hydrochlorothiazide (MAXZIDE-25) 37.5-25 MG per tablet Take 1 tablet by mouth daily.    Historical Provider, MD  Vitamin D, Ergocalciferol, (DRISDOL) 50000 UNITS CAPS capsule Take 50,000 Units by mouth every 7 (seven) days.    Historical Provider, MD   BP 137/94 mmHg  Pulse 65  Temp(Src) 98.3 F (36.8 C) (Oral)  Resp 18  Ht  (1.676 m)  Wt 208 lb (94.348 kg)  BMI 33.59  kg/m2  SpO2 97%  Vitals reviewed Physical Exam  Physical Examination: General appearance - alert, well appearing, and in no distress Mental status - alert, oriented to person, place, and time Eyes -no conjunctival injection, no scleral icterus Ears - bilateral TM's and external ear canals normal Face- ttp over left maxillary sinus Mouth - mucous membranes moist, pharynx normal without lesions, mild posterior erythema, palate symmetric, uvula midline Neck - supple, no significant adenopathy Chest - clear to auscultation, no wheezes, rales or rhonchi, symmetric air entry Heart - normal rate, regular rhythm, normal S1, S2, no murmurs, rubs, clicks or gallops Neurological - alert, oriented, normal speech, Extremities - peripheral pulses normal, no pedal edema, no clubbing or cyanosis Skin - normal coloration and turgor, no rashes  ED Course  Procedures (including critical care time) Labs Review Labs Reviewed - No data to display  Imaging Review No results found.    EKG Interpretation None      MDM   Final diagnoses:  Acute maxillary sinusitis, recurrence not specified    Pt presenting with sore throat, ear pain, sinus pain and tenderness on exam.  Will treat with afrin and amoxicillin for acute sinusitis.  Discharged with strict return precautions.  Pt agreeable with plan.    Jerelyn Scott, MD 08/31/15 [phone]

## 2016-09-18 ENCOUNTER — Emergency Department (HOSPITAL_BASED_OUTPATIENT_CLINIC_OR_DEPARTMENT_OTHER)
Admission: EM | Admit: 2016-09-18 | Discharge: 2016-09-19 | Disposition: A | Discharge status: Home / Self Care | Payer: Self-pay | Attending: Emergency Medicine | Admitting: Emergency Medicine

## 2016-09-18 ENCOUNTER — Encounter (HOSPITAL_BASED_OUTPATIENT_CLINIC_OR_DEPARTMENT_OTHER): Payer: Self-pay | Admitting: *Deleted

## 2016-09-18 DIAGNOSIS — Z7982 Long term (current) use of aspirin: Secondary | ICD-10-CM | POA: Insufficient documentation

## 2016-09-18 DIAGNOSIS — Z79899 Other long term (current) drug therapy: Secondary | ICD-10-CM | POA: Insufficient documentation

## 2016-09-18 DIAGNOSIS — N390 Urinary tract infection, site not specified: Secondary | ICD-10-CM | POA: Insufficient documentation

## 2016-09-18 DIAGNOSIS — B9689 Other specified bacterial agents as the cause of diseases classified elsewhere: Secondary | ICD-10-CM

## 2016-09-18 DIAGNOSIS — J45909 Unspecified asthma, uncomplicated: Secondary | ICD-10-CM | POA: Insufficient documentation

## 2016-09-18 DIAGNOSIS — I10 Essential (primary) hypertension: Secondary | ICD-10-CM | POA: Insufficient documentation

## 2016-09-18 DIAGNOSIS — N760 Acute vaginitis: Secondary | ICD-10-CM | POA: Insufficient documentation

## 2016-09-18 NOTE — ED Triage Notes (Signed)
Pt c/o vaginal discharge and lower abd pain x 2 days

## 2016-09-19 LAB — URINALYSIS, ROUTINE W REFLEX MICROSCOPIC
Bilirubin Urine: NEGATIVE
GLUCOSE, UA: NEGATIVE mg/dL
Ketones, ur: NEGATIVE mg/dL
Nitrite: NEGATIVE
PROTEIN: NEGATIVE mg/dL
SPECIFIC GRAVITY, URINE: 1.028 (ref 1.005–1.030)
pH: 5.5 (ref 5.0–8.0)

## 2016-09-19 LAB — URINE MICROSCOPIC-ADD ON

## 2016-09-19 LAB — WET PREP, GENITAL
Sperm: NONE SEEN
TRICH WET PREP: NONE SEEN
Yeast Wet Prep HPF POC: NONE SEEN

## 2016-09-19 LAB — PREGNANCY, URINE: Preg Test, Ur: NEGATIVE

## 2016-09-19 MED ORDER — METRONIDAZOLE 500 MG PO TABS: 500.0000 mg | ORAL_TABLET | Freq: Two times a day (BID) | ORAL | 0 refills | Status: DC

## 2016-09-19 MED ORDER — CEPHALEXIN 500 MG PO CAPS: 500.0000 mg | ORAL_CAPSULE | Freq: Two times a day (BID) | ORAL | 0 refills | Status: DC

## 2016-09-19 MED ORDER — CEPHALEXIN 250 MG PO CAPS
1000.0000 mg | ORAL_CAPSULE | Freq: Once | ORAL | Status: AC
  Administered 2016-09-19: 1000 mg via ORAL
  Filled 2016-09-19: qty 4

## 2016-09-19 NOTE — ED Notes (Signed)
Pt verbalizes understanding of d/c instructions and denies any further needs at this time. 

## 2016-09-19 NOTE — ED Provider Notes (Signed)
[facility] Provider Note: Alexandria [name] Alexandria Ilg, MD, FACEP  CSN: [id number] MRN: [id number] ARRIVAL: 09/18/16 at 2340   CHIEF COMPLAINT  Vaginal Discharge   HISTORY OF PRESENT ILLNESS  Alexandria [name] is a 45 y.o. female with a two-day history of a clear vaginal discharge as well as mild suprapubic pain. The pain is worse with movement or palpation; she is not sure if it is worse with urination. She is status post hysterectomy with bilateral salpingo-oophorectomy. She has recently become sexually active after about 5 years of abstinence.  Past Medical History:  Diagnosis Date  . Arthritis   . Asthma   . Blood transfusion 2011   with right tka  . Fibromyalgia   . GERD (gastroesophageal reflux disease)   . H/O blood clots 2011-2012   post op clots calf right leg..   . Hypertension     Past Surgical History:  Procedure Laterality Date  . ABDOMINAL HYSTERECTOMY    . GASTRIC BYPASS  2012   sleeve bypass  . JOINT REPLACEMENT  2011   right knee  . TOTAL KNEE ARTHROPLASTY  04/07/2012   Procedure: TOTAL KNEE ARTHROPLASTY;  Surgeon: Valeria BatmanPeter W Whitfield, MD;  [facility];  Service: Orthopedics;  Laterality: Left;  . TUBAL LIGATION      Family History  Problem Relation Age of Onset  . Anesthesia problems Neg Hx     Social History  Substance Use Topics  . Smoking status: Never Smoker  . Smokeless tobacco: Not on file  . Alcohol use No    Prior to Admission medications   Medication Sig Start Date End Date Taking? Authorizing Provider  amLODipine (NORVASC) 10 MG tablet Take 10 mg by mouth daily.    Historical Provider, MD  aspirin 81 MG tablet Take 81 mg by mouth daily.    Historical Provider, MD  cephALEXin (KEFLEX) 500 MG capsule Take 1 capsule (500 mg total) by mouth 2 (two) times daily. 09/19/16   [name], MD  Cholecalciferol (VITAMIN D PO) Take 1 tablet by mouth daily.    Historical Provider, MD  diclofenac (VOLTAREN) 75 MG EC tablet Take 75 mg by  mouth 2 (two) times daily.    Historical Provider, MD  DULoxetine (CYMBALTA) 60 MG capsule Take 60 mg by mouth daily.    Historical Provider, MD  esomeprazole (NEXIUM) 40 MG capsule Take 40 mg by mouth daily before breakfast.    Historical Provider, MD  estradiol (CLIMARA - DOSED IN MG/24 HR) 0.025 mg/24hr Place 1 patch onto the skin once a week.    Historical Provider, MD  hydrochlorothiazide (HYDRODIURIL) 25 MG tablet Take 25 mg by mouth daily.    Historical Provider, MD  magnesium chloride (SLOW-MAG) 64 MG TBEC SR tablet Take by mouth.    Historical Provider, MD  metroNIDAZOLE (FLAGYL) 500 MG tablet Take 1 tablet (500 mg total) by mouth 2 (two) times daily. One po bid x 7 days 09/19/16   [name], MD  Multiple Vitamin (MULITIVITAMIN WITH MINERALS) TABS Take 1 tablet by mouth daily.    Historical Provider, MD  Naltrexone-Bupropion HCl (CONTRAVE PO) Take by mouth.    Historical Provider, MD  traMADol (ULTRAM) 50 MG tablet Take 50 mg by mouth every 6 (six) hours as needed. For pain    Historical Provider, MD  triamterene-hydrochlorothiazide (MAXZIDE-25) 37.5-25 MG per tablet Take 1 tablet by mouth daily.    Historical Provider, MD  Vitamin D, Ergocalciferol, (DRISDOL) 50000 UNITS CAPS capsule Take 50,000  Units by mouth every 7 (seven) days.    Historical Provider, MD    Allergies Morphine and related   REVIEW OF SYSTEMS  Negative except as noted here or in the History of Present Illness.   PHYSICAL EXAMINATION  Initial Vital Signs Blood pressure (!) 144/109, pulse 65, temperature 97.8 F (36.6 C), resp. rate 18, height 5\' 6"  (1.676 m), weight 226 lb (102.5 kg), SpO2 100 %.  Examination General: Well-developed, well-nourished female in no acute distress; appearance consistent with age of record HENT: normocephalic; atraumatic Eyes: pupils equal, round and reactive to light; extraocular muscles intact Neck: supple Heart: regular rate and rhythm Lungs: clear to auscultation  bilaterally Abdomen: soft; nondistended; mild suprapubic tenderness; no masses or hepatosplenomegaly; bowel sounds present GU: No CVA tenderness; normal external genitalia; white vaginal discharge; surgical absence of cervix and uterus Extremities: No deformity; full range of motion; pulses normal Neurologic: Awake, alert and oriented; motor function intact in all extremities and symmetric; no facial droop Skin: Warm and dry Psychiatric: Normal mood and affect   RESULTS  Summary of this visit's results, reviewed by myself:   EKG Interpretation  Date/Time:    Ventricular Rate:    PR Interval:    QRS Duration:   QT Interval:    QTC Calculation:   R Axis:     Text Interpretation:        Laboratory Studies: Results for orders placed or performed during the hospital encounter of 09/18/16 (from the past 24 hour(s))  Urinalysis, Routine w reflex microscopic (not at [facility])     Status: Abnormal   Collection Time: 09/18/16 11:50 PM  Result Value Ref Range   Color, Urine YELLOW YELLOW   APPearance CLOUDY (A) CLEAR   Specific Gravity, Urine 1.028 1.005 - 1.030   pH 5.5 5.0 - 8.0   Glucose, UA NEGATIVE NEGATIVE mg/dL   Hgb urine dipstick SMALL (A) NEGATIVE   Bilirubin Urine NEGATIVE NEGATIVE   Ketones, ur NEGATIVE NEGATIVE mg/dL   Protein, ur NEGATIVE NEGATIVE mg/dL   Nitrite NEGATIVE NEGATIVE   Leukocytes, UA MODERATE (A) NEGATIVE  Pregnancy, urine     Status: None   Collection Time: 09/18/16 11:50 PM  Result Value Ref Range   Preg Test, Ur NEGATIVE NEGATIVE  Urine microscopic-add on     Status: Abnormal   Collection Time: 09/18/16 11:50 PM  Result Value Ref Range   Squamous Epithelial / LPF 6-30 (A) NONE SEEN   WBC, UA 6-30 0 - 5 WBC/hpf   RBC / HPF 0-5 0 - 5 RBC/hpf   Bacteria, UA FEW (A) NONE SEEN  Wet prep, genital     Status: Abnormal   Collection Time: 09/19/16 12:35 AM  Result Value Ref Range   Yeast Wet Prep HPF POC NONE SEEN NONE SEEN   Trich, Wet Prep NONE SEEN  NONE SEEN   Clue Cells Wet Prep HPF POC PRESENT (A) NONE SEEN   WBC, Wet Prep HPF POC FEW (A) NONE SEEN   Sperm NONE SEEN    Imaging Studies: No results found.  ED COURSE  Nursing notes and initial vitals signs, including pulse oximetry, reviewed.  Vitals:   09/18/16 2350 09/18/16 2352  BP:  (!) 144/109  Pulse:  65  Resp:  18  Temp:  97.8 F (36.6 C)  SpO2:  100%  Weight: 226 lb (102.5 kg)   Height: 5\' 6"  (1.676 m)     PROCEDURES    ED DIAGNOSES     ICD-9-CM ICD-10-CM  1. Bacterial vaginosis 616.10 N76.0    041.9 B96.89   2. Lower urinary tract infection, acute 599.0 N39.0        Paula Libra, MD 09/19/16 [phone]

## 2016-09-20 LAB — URINE CULTURE

## 2016-09-20 LAB — GC/CHLAMYDIA PROBE AMP (~~LOC~~) NOT AT ARMC
Chlamydia: NEGATIVE
Neisseria Gonorrhea: NEGATIVE

## 2016-11-23 ENCOUNTER — Encounter (HOSPITAL_BASED_OUTPATIENT_CLINIC_OR_DEPARTMENT_OTHER): Payer: Self-pay | Admitting: Emergency Medicine

## 2016-11-23 ENCOUNTER — Emergency Department (HOSPITAL_BASED_OUTPATIENT_CLINIC_OR_DEPARTMENT_OTHER)
Admission: EM | Admit: 2016-11-23 | Discharge: 2016-11-23 | Disposition: A | Discharge status: Home / Self Care | Payer: Self-pay | Attending: Emergency Medicine | Admitting: Emergency Medicine

## 2016-11-23 DIAGNOSIS — Z7982 Long term (current) use of aspirin: Secondary | ICD-10-CM | POA: Insufficient documentation

## 2016-11-23 DIAGNOSIS — H65192 Other acute nonsuppurative otitis media, left ear: Secondary | ICD-10-CM | POA: Insufficient documentation

## 2016-11-23 DIAGNOSIS — Z96651 Presence of right artificial knee joint: Secondary | ICD-10-CM | POA: Insufficient documentation

## 2016-11-23 DIAGNOSIS — J45909 Unspecified asthma, uncomplicated: Secondary | ICD-10-CM | POA: Insufficient documentation

## 2016-11-23 DIAGNOSIS — I10 Essential (primary) hypertension: Secondary | ICD-10-CM | POA: Insufficient documentation

## 2016-11-23 LAB — RAPID STREP SCREEN (MED CTR MEBANE ONLY): STREPTOCOCCUS, GROUP A SCREEN (DIRECT): NEGATIVE

## 2016-11-23 MED ORDER — AMOXICILLIN 500 MG PO CAPS: 500.0000 mg | ORAL_CAPSULE | Freq: Three times a day (TID) | ORAL | 0 refills | Status: DC

## 2016-11-23 MED ORDER — TRIAMTERENE-HCTZ 37.5-25 MG PO TABS: 1.0000 | ORAL_TABLET | Freq: Every day | ORAL | 0 refills | Status: AC

## 2016-11-23 NOTE — ED Triage Notes (Signed)
Pt reports congestion and headache for past 3 days, and feels pressure now in both ears.

## 2016-11-23 NOTE — ED Provider Notes (Signed)
[facility]   CSN: [id number] Arrival date & time: 11/23/16  1324  By signing my name below, I, Alyssa Grove, attest that this documentation has been prepared under the direction and in the presence of Sharilyn Sites, PA-C. Electronically Signed: Alyssa Grove, ED Scribe. 11/23/16. 4:20 PM.  History   Chief Complaint Chief Complaint  Patient presents with  . Otalgia  . Headache   The history is provided by the patient. No language interpreter was used.   HPI Comments: Alexandria [name] is a 45 y.o. female who presents to the Emergency Department complaining of gradual onset and worsening, constant bilateral ear pain onset 2 days. She states her symptoms gradually began to worsen this morning. Pt reports associated congestion, muffled hearing, sore throat and headache. 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  She denies fever, chest pain, SOB, dizziness, numbness, or weakness.  Pt has run out of her blood pressure medications about 2 months ago. She has not had any insurance in which to follow-up with PCP for this.  Was taking low dose maxzide previously without issue.  Past Medical History:  Diagnosis Date  . Arthritis   . Asthma   . Blood transfusion 2011   with right tka  . Fibromyalgia   . GERD (gastroesophageal reflux disease)   . H/O blood clots 2011-2012   post op clots calf right leg..   . Hypertension     Patient Active Problem List   Diagnosis Date Noted  . Osteoarthritis of knee 04/10/2012  . GERD (gastroesophageal reflux disease) 04/10/2012  . Hypertension 04/10/2012  . Asthma 04/10/2012  . Obesity, Class II, BMI 35-39.9, with comorbidity 04/10/2012  . History of DVT of lower extremity 04/10/2012    Past Surgical History:  Procedure Laterality Date  . ABDOMINAL HYSTERECTOMY    . GASTRIC BYPASS  2012   sleeve bypass  . JOINT REPLACEMENT  2011   right knee  . TONSILLECTOMY     . TOTAL KNEE ARTHROPLASTY  04/07/2012   Procedure: TOTAL KNEE ARTHROPLASTY;  Surgeon: Valeria Batman, MD;  Location: [facility] OR;  Service: Orthopedics;  Laterality: Left;  . TUBAL LIGATION      OB History    No data available       Home Medications    Prior to Admission medications   Medication Sig Start Date End Date Taking? Authorizing Provider  amLODipine (NORVASC) 10 MG tablet Take 10 mg by mouth daily.    Historical Provider, MD  aspirin 81 MG tablet Take 81 mg by mouth daily.    Historical Provider, MD  cephALEXin (KEFLEX) 500 MG capsule Take 1 capsule (500 mg total) by mouth 2 (two) times daily. 09/19/16   John Molpus, MD  Cholecalciferol (VITAMIN D PO) Take 1 tablet by mouth daily.    Historical Provider, MD  diclofenac (VOLTAREN) 75 MG EC tablet Take 75 mg by mouth 2 (two) times daily.    Historical Provider, MD  DULoxetine (CYMBALTA) 60 MG capsule Take 60 mg by mouth daily.    Historical Provider, MD  esomeprazole (NEXIUM) 40 MG capsule Take 40 mg by mouth daily before breakfast.    Historical Provider, MD  estradiol (CLIMARA - DOSED IN MG/24 HR) 0.025 mg/24hr Place 1 patch onto the skin once a week.    Historical Provider, MD  hydrochlorothiazide (HYDRODIURIL) 25 MG tablet Take 25 mg by mouth daily.    Historical Provider, MD  magnesium chloride (SLOW-MAG) 64 MG TBEC SR tablet Take by mouth.    Historical Provider, MD  metroNIDAZOLE (FLAGYL) 500 MG tablet Take 1 tablet (500 mg total) by mouth 2 (two) times daily. One po bid x 7 days 09/19/16   Paula LibraJohn Molpus, MD  Multiple Vitamin (MULITIVITAMIN WITH MINERALS) TABS Take 1 tablet by mouth daily.    Historical Provider, MD  Naltrexone-Bupropion HCl (CONTRAVE PO) Take by mouth.    Historical Provider, MD  traMADol (ULTRAM) 50 MG tablet Take 50 mg by mouth every 6 (six) hours as needed. For pain    Historical Provider, MD  triamterene-hydrochlorothiazide (MAXZIDE-25) 37.5-25 MG per tablet Take 1 tablet by mouth daily.    Historical  Provider, MD  Vitamin D, Ergocalciferol, (DRISDOL) 50000 UNITS CAPS capsule Take 50,000 Units by mouth every 7 (seven) days.    Historical Provider, MD    Family History Family History  Problem Relation Age of Onset  . Anesthesia problems Neg Hx     Social History Social History  Substance Use Topics  . Smoking status: Never Smoker  . Smokeless tobacco: Never Used  . Alcohol use No     Allergies   Morphine and related   Review of Systems Review of Systems  HENT: Positive for congestion, ear pain, hearing loss (muffled hearing) and sore throat.   Neurological: Positive for headaches.  All other systems reviewed and are negative.  Physical Exam Updated Vital Signs BP 150/97 (BP Location: Right Arm)   Pulse 82   Temp 98.2 F (36.8 C) (Oral)   Resp 18   Ht 5\' 6"  (1.676 m)   Wt 230 lb (104.3 kg)   SpO2 100%   BMI 37.12 kg/m   Physical Exam  Constitutional: She is oriented to person, place, and time. She appears well-developed and well-nourished. No distress.  HENT:  Head: Normocephalic and atraumatic.  Right Ear: External ear normal. A middle ear effusion is present.  Left Ear: External ear normal. A middle ear effusion is present.  Mouth/Throat: Oropharynx is clear and moist.  Bilateral ear effusions, left EAC and TM appear erythematous; no signs of rupture, no mastoid tenderness Tonsils overall normal in appearance bilaterally without exudate; uvula midline without evidence of peritonsillar abscess; handling secretions appropriately; no difficulty swallowing or speaking; normal phonation without stridor  Eyes: Conjunctivae and EOM are normal. Pupils are equal, round, and reactive to light.  Neck: Normal range of motion and full passive range of motion without pain. Neck supple. No neck rigidity.  No rigidity, no meningismus  Cardiovascular: Normal rate, regular rhythm and normal heart sounds.   No murmur heard. Pulmonary/Chest: Effort normal and breath sounds  normal. No respiratory distress. She has no wheezes. She has no rhonchi.  Abdominal: Soft. Bowel sounds are normal. There is no tenderness. There is no guarding.  Musculoskeletal: Normal range of motion. She exhibits no edema.  Neurological: She is alert and oriented to person, place, and time. She has normal strength. She displays no tremor. No cranial nerve deficit or sensory deficit. She displays no seizure activity.  AAOx3, answering questions and following commands appropriately; equal strength UE and LE bilaterally; CN grossly intact; moves all extremities appropriately without ataxia; no focal neuro deficits or facial asymmetry appreciated  Skin: Skin is warm and dry. No rash noted. She is not diaphoretic.  Psychiatric: She has a normal mood and affect. Her behavior is normal. Thought content normal.  Nursing note and vitals reviewed.   ED Treatments / Results  DIAGNOSTIC STUDIES: Oxygen Saturation is 100% on RA, normal by my interpretation.    COORDINATION OF CARE: 4:11 PM Discussed treatment plan with pt at bedside which includes medication refill and pt agreed to plan.  Labs (all labs ordered are listed, but only abnormal results are displayed) Labs Reviewed  RAPID STREP SCREEN (NOT AT [facility])  CULTURE, GROUP A STREP [facility])    EKG  EKG Interpretation None       Radiology No results found.  Procedures Procedures (including critical care time)  Medications Ordered in ED Medications - No data to display   Initial Impression / Assessment and Plan / ED Course  I have reviewed the triage vital signs and the nursing notes.  Pertinent labs & imaging results that were available during my care of the patient were reviewed by me and considered in my medical decision making (see chart for details).  Clinical Course    45 year old female here with ear pain and congestion for the past 2 days. Also reports a mild, generalized headache. She is afebrile and nontoxic. Her  neurologic exam is nonfocal. She has no signs or symptoms concerning for meningitis. She does have maxillary sinus pressure and middle ear effusions noted on exam. Her left EAC and TM appear erythematous without signs of rupture. No mastoid tenderness.  Rapid strep screen negative.  Lungs are clear without wheezes or rhonchi to suggest pneumonia.  Will start on amoxicillin for treatment of developing OM left ear.    Patient also with mildly elevated BP 133/97.  She has been off her meds for 2 months due to lack of insurance.  No complaint of CP or SOB.  Elevated BP may be contributing to her headaches.  Do not suspect any acute end organ damage today.  Will re-start home meds, have her follow-up with PCP.  Discussed plan with patient, she acknowledged understanding and agreed with plan of care.  Return precautions given for new or worsening symptoms.  Final Clinical Impressions(s) / ED Diagnoses   Final diagnoses:  Acute otitis media with effusion of left ear  Essential hypertension    New Prescriptions New Prescriptions   AMOXICILLIN (AMOXIL) 500 MG CAPSULE    Take 1 capsule (500 mg total) by mouth 3 (three) times daily.   TRIAMTERENE-HYDROCHLOROTHIAZIDE (MAXZIDE-25) 37.5-25 MG TABLET    Take 1 tablet by mouth daily.    I personally performed the services described in this documentation, which was scribed in my presence. The recorded information has been reviewed and is accurate.  Garlon HatchetLisa M Berlene Dixson, PA-C 11/23/16 1632    Garlon HatchetLisa M Deitra Craine, PA-C 11/23/16 1736    Vanetta MuldersScott Zackowski, MD 11/24/16 2328

## 2016-11-23 NOTE — Discharge Instructions (Signed)
Take the prescribed medication as directed.   You may wish to try Vit C, echinacea, Airborne, etc to help prevent illness. Follow-up with a primary care doctor for ongoing management of your blood pressure. Return to the ED for new or worsening symptoms.

## 2016-11-26 LAB — CULTURE, GROUP A STREP (THRC)

## 2017-01-12 ENCOUNTER — Emergency Department (HOSPITAL_BASED_OUTPATIENT_CLINIC_OR_DEPARTMENT_OTHER): Payer: BLUE CROSS/BLUE SHIELD

## 2017-01-12 ENCOUNTER — Emergency Department (HOSPITAL_BASED_OUTPATIENT_CLINIC_OR_DEPARTMENT_OTHER)
Admission: EM | Admit: 2017-01-12 | Discharge: 2017-01-12 | Disposition: A | Discharge status: Home / Self Care | Payer: BLUE CROSS/BLUE SHIELD | Attending: Emergency Medicine | Admitting: Emergency Medicine

## 2017-01-12 ENCOUNTER — Encounter (HOSPITAL_BASED_OUTPATIENT_CLINIC_OR_DEPARTMENT_OTHER): Payer: Self-pay | Admitting: Emergency Medicine

## 2017-01-12 DIAGNOSIS — Z7982 Long term (current) use of aspirin: Secondary | ICD-10-CM | POA: Insufficient documentation

## 2017-01-12 DIAGNOSIS — R0981 Nasal congestion: Secondary | ICD-10-CM | POA: Insufficient documentation

## 2017-01-12 DIAGNOSIS — R69 Illness, unspecified: Secondary | ICD-10-CM

## 2017-01-12 DIAGNOSIS — Z79899 Other long term (current) drug therapy: Secondary | ICD-10-CM | POA: Diagnosis not present

## 2017-01-12 DIAGNOSIS — R05 Cough: Secondary | ICD-10-CM | POA: Insufficient documentation

## 2017-01-12 DIAGNOSIS — J111 Influenza due to unidentified influenza virus with other respiratory manifestations: Secondary | ICD-10-CM

## 2017-01-12 DIAGNOSIS — M791 Myalgia: Secondary | ICD-10-CM | POA: Insufficient documentation

## 2017-01-12 DIAGNOSIS — R509 Fever, unspecified: Secondary | ICD-10-CM | POA: Insufficient documentation

## 2017-01-12 DIAGNOSIS — R51 Headache: Secondary | ICD-10-CM | POA: Diagnosis not present

## 2017-01-12 DIAGNOSIS — J45909 Unspecified asthma, uncomplicated: Secondary | ICD-10-CM | POA: Insufficient documentation

## 2017-01-12 DIAGNOSIS — I10 Essential (primary) hypertension: Secondary | ICD-10-CM | POA: Diagnosis not present

## 2017-01-12 MED ORDER — ONDANSETRON 4 MG PO TBDP: ORAL_TABLET | ORAL | 0 refills | Status: AC

## 2017-01-12 MED ORDER — AMOXICILLIN-POT CLAVULANATE 875-125 MG PO TABS: 1.0000 | ORAL_TABLET | Freq: Two times a day (BID) | ORAL | 0 refills | Status: DC

## 2017-01-12 MED ORDER — ONDANSETRON 8 MG PO TBDP
8.0000 mg | ORAL_TABLET | Freq: Once | ORAL | Status: AC
  Administered 2017-01-12: 8 mg via ORAL
  Filled 2017-01-12: qty 1

## 2017-01-12 MED ORDER — IBUPROFEN 800 MG PO TABS
800.0000 mg | ORAL_TABLET | Freq: Once | ORAL | Status: AC
  Administered 2017-01-12: 800 mg via ORAL
  Filled 2017-01-12: qty 1

## 2017-01-12 MED ORDER — ACETAMINOPHEN 500 MG PO TABS
1000.0000 mg | ORAL_TABLET | Freq: Once | ORAL | Status: AC
  Administered 2017-01-12: 1000 mg via ORAL
  Filled 2017-01-12: qty 2

## 2017-01-12 MED ORDER — OSELTAMIVIR PHOSPHATE 75 MG PO CAPS: 75.0000 mg | ORAL_CAPSULE | Freq: Two times a day (BID) | ORAL | 0 refills | Status: DC

## 2017-01-12 MED ORDER — BENZONATATE 100 MG PO CAPS: 100.0000 mg | ORAL_CAPSULE | Freq: Three times a day (TID) | ORAL | 0 refills | Status: AC

## 2017-01-12 NOTE — ED Notes (Signed)
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 Pt presents with c/o of cough, body aches, chills.

## 2017-01-12 NOTE — ED Provider Notes (Signed)
[facility] Provider Note   CSN: [id number] Arrival date & time: 01/12/17  0043     History   Chief Complaint Chief Complaint  Patient presents with  . Generalized Body Aches    HPI Alexandria [name] is a 46 y.o. female.  46 yoF with cough congestion fevers chills myalgias and headaches. Going on since yesterday. Also having some vomiting. Unsure of sick contacts. Symptoms are getting worse.   The history is provided by the patient.  Illness  This is a new problem. The current episode started yesterday. The problem occurs constantly. The problem has not changed since onset.Associated symptoms include headaches. Pertinent negatives include no chest pain and no shortness of breath. Nothing aggravates the symptoms. Nothing relieves the symptoms. She has tried nothing for the symptoms. The treatment provided no relief.    Past Medical History:  Diagnosis Date  . Arthritis   . Asthma   . Blood transfusion 2011   with right tka  . Fibromyalgia   . GERD (gastroesophageal reflux disease)   . H/O blood clots 2011-2012   post op clots calf right leg..   . Hypertension     Patient Active Problem List   Diagnosis Date Noted  . Osteoarthritis of knee 04/10/2012  . GERD (gastroesophageal reflux disease) 04/10/2012  . Hypertension 04/10/2012  . Asthma 04/10/2012  . Obesity, Class II, BMI 35-39.9, with comorbidity 04/10/2012  . History of DVT of lower extremity 04/10/2012    Past Surgical History:  Procedure Laterality Date  . ABDOMINAL HYSTERECTOMY    . GASTRIC BYPASS  2012   sleeve bypass  . JOINT REPLACEMENT  2011   right knee  . TONSILLECTOMY    . TOTAL KNEE ARTHROPLASTY  04/07/2012   Procedure: TOTAL KNEE ARTHROPLASTY;  Surgeon: Valeria Batman, MD;  Location: [facility] OR;  Service: Orthopedics;  Laterality: Left;  . TUBAL LIGATION      OB History    No data available       Home Medications    Prior to Admission medications   Medication Sig  Start Date End Date Taking? Authorizing Provider  amLODipine (NORVASC) 10 MG tablet Take 10 mg by mouth daily.    Historical Provider, MD  amoxicillin (AMOXIL) 500 MG capsule Take 1 capsule (500 mg total) by mouth 3 (three) times daily. 11/23/16   Garlon Hatchet, PA-C  amoxicillin-clavulanate (AUGMENTIN) 875-125 MG tablet Take 1 tablet by mouth every 12 (twelve) hours. 01/12/17   Melene Plan, DO  aspirin 81 MG tablet Take 81 mg by mouth daily.    Historical Provider, MD  benzonatate (TESSALON) 100 MG capsule Take 1 capsule (100 mg total) by mouth every 8 (eight) hours. 01/12/17   Melene Plan, DO  cephALEXin (KEFLEX) 500 MG capsule Take 1 capsule (500 mg total) by mouth 2 (two) times daily. 09/19/16   John Molpus, MD  Cholecalciferol (VITAMIN D PO) Take 1 tablet by mouth daily.    Historical Provider, MD  diclofenac (VOLTAREN) 75 MG EC tablet Take 75 mg by mouth 2 (two) times daily.    Historical Provider, MD  DULoxetine (CYMBALTA) 60 MG capsule Take 60 mg by mouth daily.    Historical Provider, MD  esomeprazole (NEXIUM) 40 MG capsule Take 40 mg by mouth daily before breakfast.    Historical Provider, MD  estradiol (CLIMARA - DOSED IN MG/24 HR) 0.025 mg/24hr Place 1 patch onto the skin once a week.    Historical Provider, MD  hydrochlorothiazide (HYDRODIURIL) 25  MG tablet Take 25 mg by mouth daily.    Historical Provider, MD  magnesium chloride (SLOW-MAG) 64 MG TBEC SR tablet Take by mouth.    Historical Provider, MD  metroNIDAZOLE (FLAGYL) 500 MG tablet Take 1 tablet (500 mg total) by mouth 2 (two) times daily. One po bid x 7 days 09/19/16   Paula LibraJohn Molpus, MD  Multiple Vitamin (MULITIVITAMIN WITH MINERALS) TABS Take 1 tablet by mouth daily.    Historical Provider, MD  Naltrexone-Bupropion HCl (CONTRAVE PO) Take by mouth.    Historical Provider, MD  ondansetron (ZOFRAN ODT) 4 MG disintegrating tablet 4mg  ODT q4 hours prn nausea/vomit 01/12/17   Melene Planan Delainee Tramel, DO  oseltamivir (TAMIFLU) 75 MG capsule Take 1 capsule  (75 mg total) by mouth every 12 (twelve) hours. 01/12/17   Melene Planan Alexandria Sterne, DO  traMADol (ULTRAM) 50 MG tablet Take 50 mg by mouth every 6 (six) hours as needed. For pain    Historical Provider, MD  triamterene-hydrochlorothiazide (MAXZIDE-25) 37.5-25 MG tablet Take 1 tablet by mouth daily. 11/23/16   Garlon HatchetLisa M Sanders, PA-C  Vitamin D, Ergocalciferol, (DRISDOL) 50000 UNITS CAPS capsule Take 50,000 Units by mouth every 7 (seven) days.    Historical Provider, MD    Family History Family History  Problem Relation Age of Onset  . Anesthesia problems Neg Hx     Social History Social History  Substance Use Topics  . Smoking status: Never Smoker  . Smokeless tobacco: Never Used  . Alcohol use No     Allergies   Morphine and related   Review of Systems Review of Systems  Constitutional: Positive for chills and fever.  HENT: Positive for congestion. Negative for rhinorrhea.   Eyes: Negative for redness and visual disturbance.  Respiratory: Positive for cough. Negative for shortness of breath and wheezing.   Cardiovascular: Negative for chest pain and palpitations.  Gastrointestinal: Negative for nausea and vomiting.  Genitourinary: Negative for dysuria and urgency.  Musculoskeletal: Positive for myalgias. Negative for arthralgias.  Skin: Negative for pallor and wound.  Neurological: Positive for headaches. Negative for dizziness.     Physical Exam Updated Vital Signs BP 157/98 (BP Location: Right Arm)   Pulse 93   Temp 101.3 F (38.5 C) (Oral)   Resp 18   Ht 5\' 6"  (1.676 m)   Wt 231 lb (104.8 kg)   SpO2 97%   BMI 37.28 kg/m   Physical Exam  Constitutional: She is oriented to person, place, and time. She appears well-developed and well-nourished. No distress.  HENT:  Head: Normocephalic and atraumatic.  Swollen turbinates, posterior nasal drip, no noted sinus ttp, tm normal bilaterally.    Eyes: EOM are normal. Pupils are equal, round, and reactive to light.  Neck: Normal  range of motion. Neck supple.  Cardiovascular: Normal rate and regular rhythm.  Exam reveals no gallop and no friction rub.   No murmur heard. Pulmonary/Chest: Effort normal. She has no wheezes. She has no rales.  Abdominal: Soft. She exhibits no distension and no mass. There is no tenderness. There is no guarding.  Musculoskeletal: She exhibits no edema or tenderness.  Neurological: She is alert and oriented to person, place, and time.  Skin: Skin is warm and dry. She is not diaphoretic.  Psychiatric: She has a normal mood and affect. Her behavior is normal.  Nursing note and vitals reviewed.    ED Treatments / Results  Labs (all labs ordered are listed, but only abnormal results are displayed) Labs Reviewed - No data  to display  EKG  EKG Interpretation None       Radiology Dg Chest 2 View  Result Date: 01/12/2017 CLINICAL DATA:  Cough and congestion.  Fever.  Body aches. EXAM: CHEST  2 VIEW COMPARISON:  Radiographs 03/30/2012 FINDINGS: Low lung volumes. Bronchial thickening versus bronchovascular crowding. The cardiomediastinal contours are normal. Pulmonary vasculature is normal. No consolidation, pleural effusion, or pneumothorax. No acute osseous abnormalities are seen. IMPRESSION: Low lung volumes. Bronchial thickening versus bronchovascular crowding secondary to hypoaeration. Electronically Signed   By: Rubye Oaks M.D.   On: 01/12/2017 01:49    Procedures Procedures (including critical care time)  Medications Ordered in ED Medications  acetaminophen (TYLENOL) tablet 1,000 mg (1,000 mg Oral Given 01/12/17 0347)  ibuprofen (ADVIL,MOTRIN) tablet 800 mg (800 mg Oral Given 01/12/17 0347)  ondansetron (ZOFRAN-ODT) disintegrating tablet 8 mg (8 mg Oral Given 01/12/17 0347)     Initial Impression / Assessment and Plan / ED Course  I have reviewed the triage vital signs and the nursing notes.  Pertinent labs & imaging results that were available during my care of the patient  were reviewed by me and considered in my medical decision making (see chart for details).     46 yoF Within influenza-like illness. Will treat symptomatically. She is requesting Tamiflu. Discharge home.    I have discussed the diagnosis/risks/treatment options with the patient and believe the pt to be eligible for discharge home to follow-up with PCP. We also discussed returning to the ED immediately if new or worsening sx occur. We discussed the sx which are most concerning (e.g., sudden worsening pain, fever, inability to tolerate by mouth) that necessitate immediate return. Medications administered to the patient during their visit and any new prescriptions provided to the patient are listed below.  Medications given during this visit Medications  acetaminophen (TYLENOL) tablet 1,000 mg (1,000 mg Oral Given 01/12/17 0347)  ibuprofen (ADVIL,MOTRIN) tablet 800 mg (800 mg Oral Given 01/12/17 0347)  ondansetron (ZOFRAN-ODT) disintegrating tablet 8 mg (8 mg Oral Given 01/12/17 0347)     The patient appears reasonably screen and/or stabilized for discharge and I doubt any other medical condition or other [facility] requiring further screening, evaluation, or treatment in the ED at this time prior to discharge.    Final Clinical Impressions(s) / ED Diagnoses   Final diagnoses:  Influenza-like illness    New Prescriptions Discharge Medication List as of 01/12/2017  3:31 AM    START taking these medications   Details  amoxicillin-clavulanate (AUGMENTIN) 875-125 MG tablet Take 1 tablet by mouth every 12 (twelve) hours., Starting Sun 01/12/2017, Print    benzonatate (TESSALON) 100 MG capsule Take 1 capsule (100 mg total) by mouth every 8 (eight) hours., Starting Sun 01/12/2017, Print    ondansetron (ZOFRAN ODT) 4 MG disintegrating tablet 4mg  ODT q4 hours prn nausea/vomit, Print    oseltamivir (TAMIFLU) 75 MG capsule Take 1 capsule (75 mg total) by mouth every 12 (twelve) hours., Starting Sun 01/12/2017, Print          Melene Plan, DO 01/12/17 0502

## 2017-01-12 NOTE — Discharge Instructions (Signed)
Take tylenol 2 pills 4 times a day and motrin 4 pills 3 times a day.  Drink plenty of fluids.  Return for worsening shortness of breath, headache, confusion. Follow up with your family doctor.   

## 2017-01-12 NOTE — ED Triage Notes (Signed)
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

## 2017-01-14 ENCOUNTER — Emergency Department (HOSPITAL_BASED_OUTPATIENT_CLINIC_OR_DEPARTMENT_OTHER)
Admission: EM | Admit: 2017-01-14 | Discharge: 2017-01-14 | Disposition: A | Discharge status: Home / Self Care | Payer: BLUE CROSS/BLUE SHIELD | Attending: Emergency Medicine | Admitting: Emergency Medicine

## 2017-01-14 ENCOUNTER — Encounter (HOSPITAL_BASED_OUTPATIENT_CLINIC_OR_DEPARTMENT_OTHER): Payer: Self-pay | Admitting: *Deleted

## 2017-01-14 DIAGNOSIS — Z7982 Long term (current) use of aspirin: Secondary | ICD-10-CM | POA: Insufficient documentation

## 2017-01-14 DIAGNOSIS — J45909 Unspecified asthma, uncomplicated: Secondary | ICD-10-CM | POA: Insufficient documentation

## 2017-01-14 DIAGNOSIS — Z79899 Other long term (current) drug therapy: Secondary | ICD-10-CM | POA: Insufficient documentation

## 2017-01-14 DIAGNOSIS — R509 Fever, unspecified: Secondary | ICD-10-CM | POA: Diagnosis present

## 2017-01-14 DIAGNOSIS — J111 Influenza due to unidentified influenza virus with other respiratory manifestations: Secondary | ICD-10-CM

## 2017-01-14 DIAGNOSIS — H9203 Otalgia, bilateral: Secondary | ICD-10-CM | POA: Diagnosis not present

## 2017-01-14 DIAGNOSIS — I10 Essential (primary) hypertension: Secondary | ICD-10-CM | POA: Diagnosis not present

## 2017-01-14 MED ORDER — HYDROCODONE-ACETAMINOPHEN 7.5-325 MG/15ML PO SOLN
10.0000 mL | Freq: Once | ORAL | Status: AC
  Administered 2017-01-14: 10 mL via ORAL
  Filled 2017-01-14: qty 15

## 2017-01-14 MED ORDER — HYDROCODONE-ACETAMINOPHEN 7.5-325 MG/15ML PO SOLN: ORAL | 0 refills | Status: DC

## 2017-01-14 NOTE — ED Provider Notes (Signed)
[facility] Provider Note: [name], MD, FACEP  CSN: [id number] MRN: [id number] ARRIVAL: 01/14/17 at 0506 ROOM: [facility]06/[facility]06   CHIEF COMPLAINT  Influenza   HISTORY OF PRESENT ILLNESS  Alexandria [name] is a 46 y.o. female with several days of flulike symptoms. Specifically she is having fever, headache, bilateral ear pain, cough, sore throat as a result of coughing, nausea, diarrhea, general malaise and nasal congestion. She has not been vomiting due to previous GERD surgery. She was seen 2 days ago and started on Augmentin, Tamiflu, Zofran and Tessalon Perles. She has been taking ibuprofen and acetaminophen for pain and fever. She returns complaining of severe pain in her ears bilaterally as well as a headache and generalized body aches. These have not been adequately relieved by the acetaminophen and ibuprofen. She has also had some tinnitus in her right ear.  Consultation with the [facility] state controlled substances database reveals the patient has received no narcotics in the past year.   Past Medical History:  Diagnosis Date  . Arthritis   . Asthma   . Blood transfusion 2011   with right tka  . Fibromyalgia   . GERD (gastroesophageal reflux disease)   . H/O blood clots 2011-2012   post op clots calf right leg..   . Hypertension     Past Surgical History:  Procedure Laterality Date  . ABDOMINAL HYSTERECTOMY    . GASTRIC BYPASS  2012   sleeve bypass  . JOINT REPLACEMENT  2011   right knee  . TONSILLECTOMY    . TOTAL KNEE ARTHROPLASTY  04/07/2012   Procedure: TOTAL KNEE ARTHROPLASTY;  Surgeon: Valeria Batman, MD;  Location: [facility] OR;  Service: Orthopedics;  Laterality: Left;  . TUBAL LIGATION      Family History  Problem Relation Age of Onset  . Anesthesia problems Neg Hx     Social History  Substance Use Topics  . Smoking status: Never Smoker  . Smokeless tobacco: Never Used  . Alcohol use No    Prior to Admission medications     Medication Sig Start Date End Date Taking? Authorizing Provider  amLODipine (NORVASC) 10 MG tablet Take 10 mg by mouth daily.    Historical Provider, MD  amoxicillin-clavulanate (AUGMENTIN) 875-125 MG tablet Take 1 tablet by mouth every 12 (twelve) hours. 01/12/17   Melene Plan, DO  aspirin 81 MG tablet Take 81 mg by mouth daily.    Historical Provider, MD  benzonatate (TESSALON) 100 MG capsule Take 1 capsule (100 mg total) by mouth every 8 (eight) hours. 01/12/17   Melene Plan, DO  Cholecalciferol (VITAMIN D PO) Take 1 tablet by mouth daily.    Historical Provider, MD  diclofenac (VOLTAREN) 75 MG EC tablet Take 75 mg by mouth 2 (two) times daily.    Historical Provider, MD  DULoxetine (CYMBALTA) 60 MG capsule Take 60 mg by mouth daily.    Historical Provider, MD  esomeprazole (NEXIUM) 40 MG capsule Take 40 mg by mouth daily before breakfast.    Historical Provider, MD  estradiol (CLIMARA - DOSED IN MG/24 HR) 0.025 mg/24hr Place 1 patch onto the skin once a week.    Historical Provider, MD  hydrochlorothiazide (HYDRODIURIL) 25 MG tablet Take 25 mg by mouth daily.    Historical Provider, MD  magnesium chloride (SLOW-MAG) 64 MG TBEC SR tablet Take by mouth.    Historical Provider, MD  Multiple Vitamin (MULITIVITAMIN WITH MINERALS) TABS Take 1 tablet by mouth daily.  Historical Provider, MD  Naltrexone-Bupropion HCl (CONTRAVE PO) Take by mouth.    Historical Provider, MD  ondansetron (ZOFRAN ODT) 4 MG disintegrating tablet 4mg  ODT q4 hours prn nausea/vomit 01/12/17   Melene Planan Floyd, DO  oseltamivir (TAMIFLU) 75 MG capsule Take 1 capsule (75 mg total) by mouth every 12 (twelve) hours. 01/12/17   Melene Planan Floyd, DO  traMADol (ULTRAM) 50 MG tablet Take 50 mg by mouth every 6 (six) hours as needed. For pain    Historical Provider, MD  triamterene-hydrochlorothiazide (MAXZIDE-25) 37.5-25 MG tablet Take 1 tablet by mouth daily. 11/23/16   Garlon HatchetLisa M Sanders, PA-C  Vitamin D, Ergocalciferol, (DRISDOL) 50000 UNITS CAPS capsule  Take 50,000 Units by mouth every 7 (seven) days.    Historical Provider, MD    Allergies Morphine and related   REVIEW OF SYSTEMS  Negative except as noted here or in the History of Present Illness.   PHYSICAL EXAMINATION  Initial Vital Signs Blood pressure 141/94, pulse 74, temperature 99.2 F (37.3 C), temperature source Oral, resp. rate 22, height 5\' 6"  (1.676 m), weight 231 lb (104.8 kg), SpO2 96 %.  Examination General: Well-developed, well-nourished female in no acute distress; appearance consistent with age of record HENT: normocephalic; atraumatic; TMs without erythema or purulent effusion; nasal congestion; no pharyngeal erythema or exudate Eyes: pupils equal, round and reactive to light; extraocular muscles intact Neck: supple Heart: regular rate and rhythm Lungs: clear to auscultation bilaterally Abdomen: soft; nondistended; nontender; no masses or hepatosplenomegaly; bowel sounds present Extremities: No deformity; full range of motion; pulses normal Neurologic: Awake, alert and oriented; motor function intact in all extremities and symmetric; no facial droop Skin: Warm and dry Psychiatric: Flat affect   RESULTS  Summary of this visit's results, reviewed by myself:   EKG Interpretation  Date/Time:    Ventricular Rate:    PR Interval:    QRS Duration:   QT Interval:    QTC Calculation:   R Axis:     Text Interpretation:        Laboratory Studies: No results found for this or any previous visit (from the past 24 hour(s)). Imaging Studies: No results found.  ED COURSE  Nursing notes and initial vitals signs, including pulse oximetry, reviewed.  Vitals:   01/14/17 0515 01/14/17 0516 01/14/17 0522  BP:  141/94   Pulse:  74   Resp:  22   Temp:  99.2 F (37.3 C)   TempSrc:  Oral   SpO2:  96%   Weight: 231 lb (104.8 kg)    Height: 5\' 6"  (1.676 m)  5\' 6"  (1.676 m)   We'll add a brief course of hydrocodone for her ear pain. She is ready on Augmentin  which should treat a nascent otitis media.  PROCEDURES    ED DIAGNOSES     ICD-9-CM ICD-10-CM   1. Ear pain, bilateral 388.70 H92.03   2. Influenza 487.1 J11.1        Paula LibraJohn Casady Voshell, MD 01/14/17 [phone]

## 2017-01-14 NOTE — ED Triage Notes (Signed)
C/o ha, bilateral ear pain, congestion, cough, throat raw from cough  X 4 days  Was seen here for same and started on several meds

## 2017-07-04 ENCOUNTER — Encounter (HOSPITAL_BASED_OUTPATIENT_CLINIC_OR_DEPARTMENT_OTHER): Payer: Self-pay

## 2017-07-04 ENCOUNTER — Emergency Department (HOSPITAL_BASED_OUTPATIENT_CLINIC_OR_DEPARTMENT_OTHER)
Admission: EM | Admit: 2017-07-04 | Discharge: 2017-07-04 | Disposition: A | Discharge status: Home / Self Care | Payer: BLUE CROSS/BLUE SHIELD | Attending: Emergency Medicine | Admitting: Emergency Medicine

## 2017-07-04 DIAGNOSIS — Z5321 Procedure and treatment not carried out due to patient leaving prior to being seen by health care provider: Secondary | ICD-10-CM | POA: Diagnosis not present

## 2017-07-04 DIAGNOSIS — R202 Paresthesia of skin: Secondary | ICD-10-CM | POA: Insufficient documentation

## 2017-07-04 NOTE — ED Notes (Signed)
Pt states she is leaving and will go to [facility] in the morning. Pt educated on to return if symptoms worsens

## 2017-07-04 NOTE — ED Triage Notes (Signed)
Pt c/o feeling like something is crawling all over her body for over a month, has various other complaints as well

## 2018-08-21 IMAGING — CR DG CHEST 2V
2 series · 2 of 2 positions shown · non-contrast
Comparison: Radiographs 03/30/2012

CLINICAL DATA: Cough and congestion.  Fever.  Body aches.

EXAM:
CHEST  2 VIEW

[w chest pa]
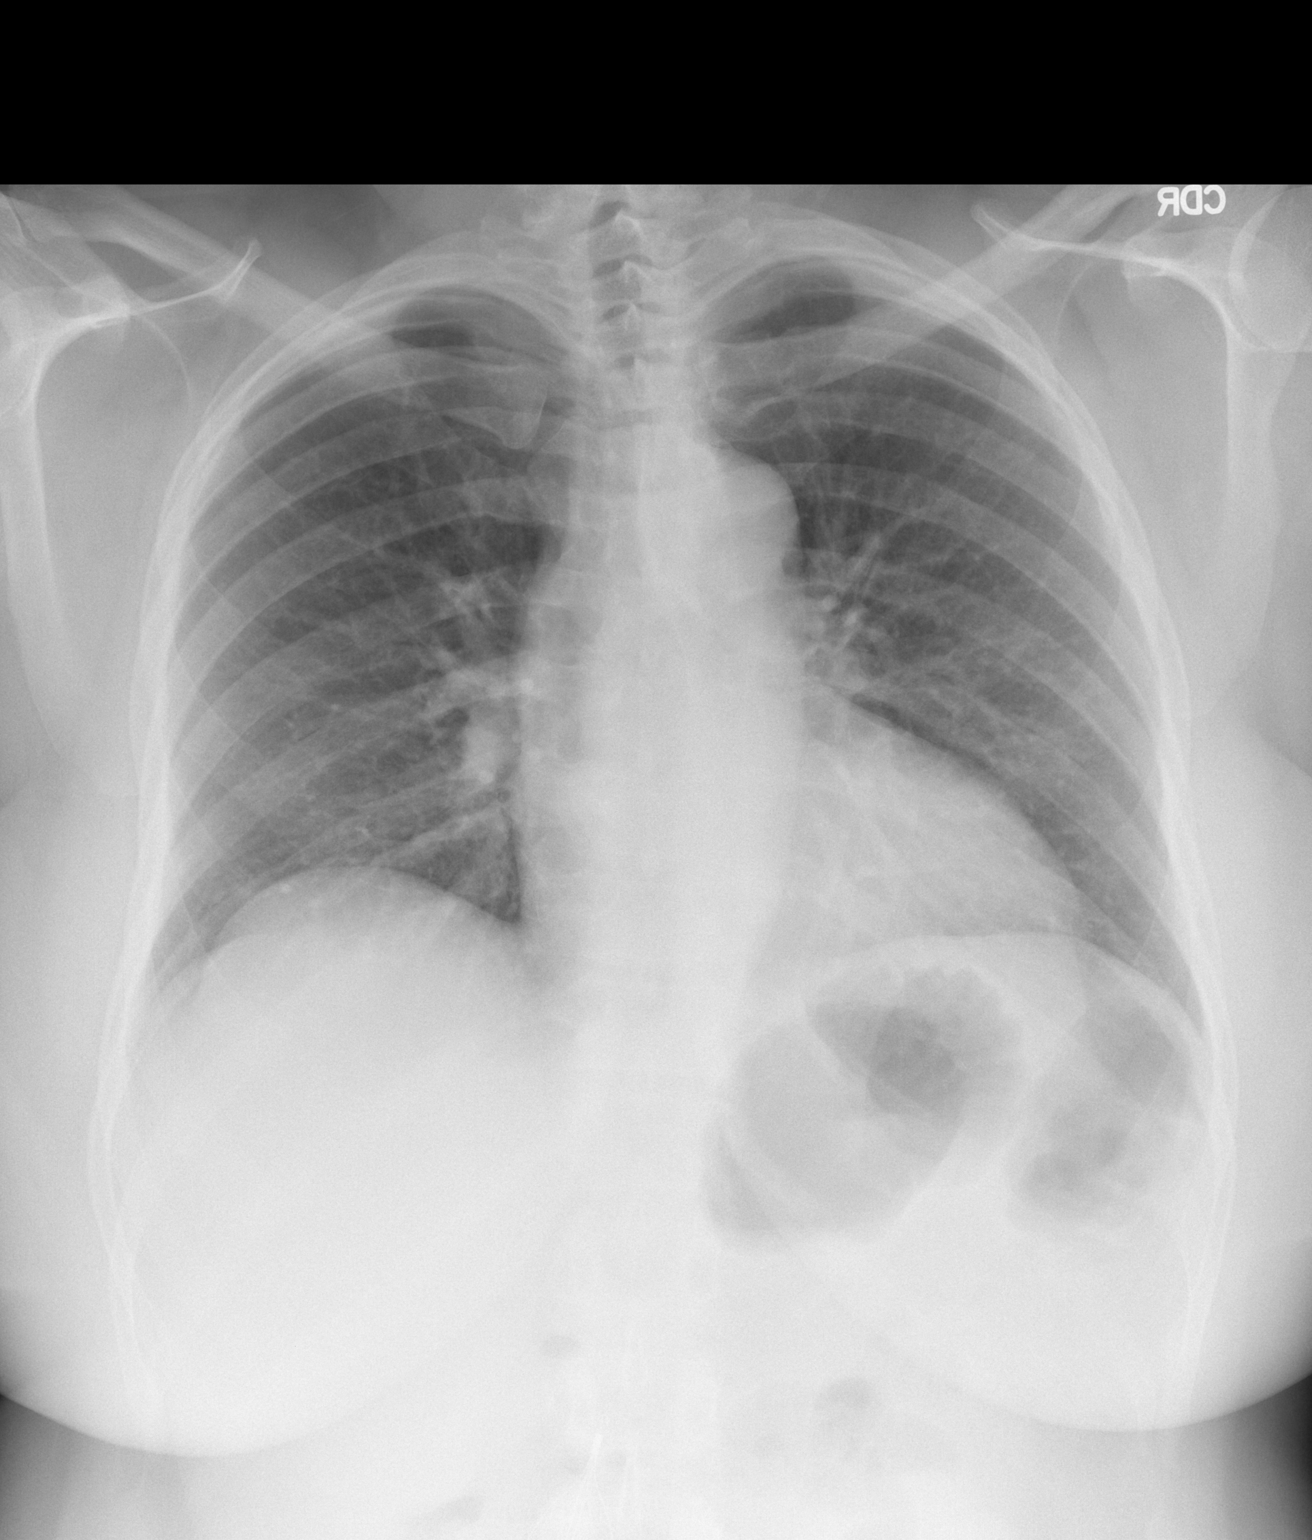

[w chest lat]
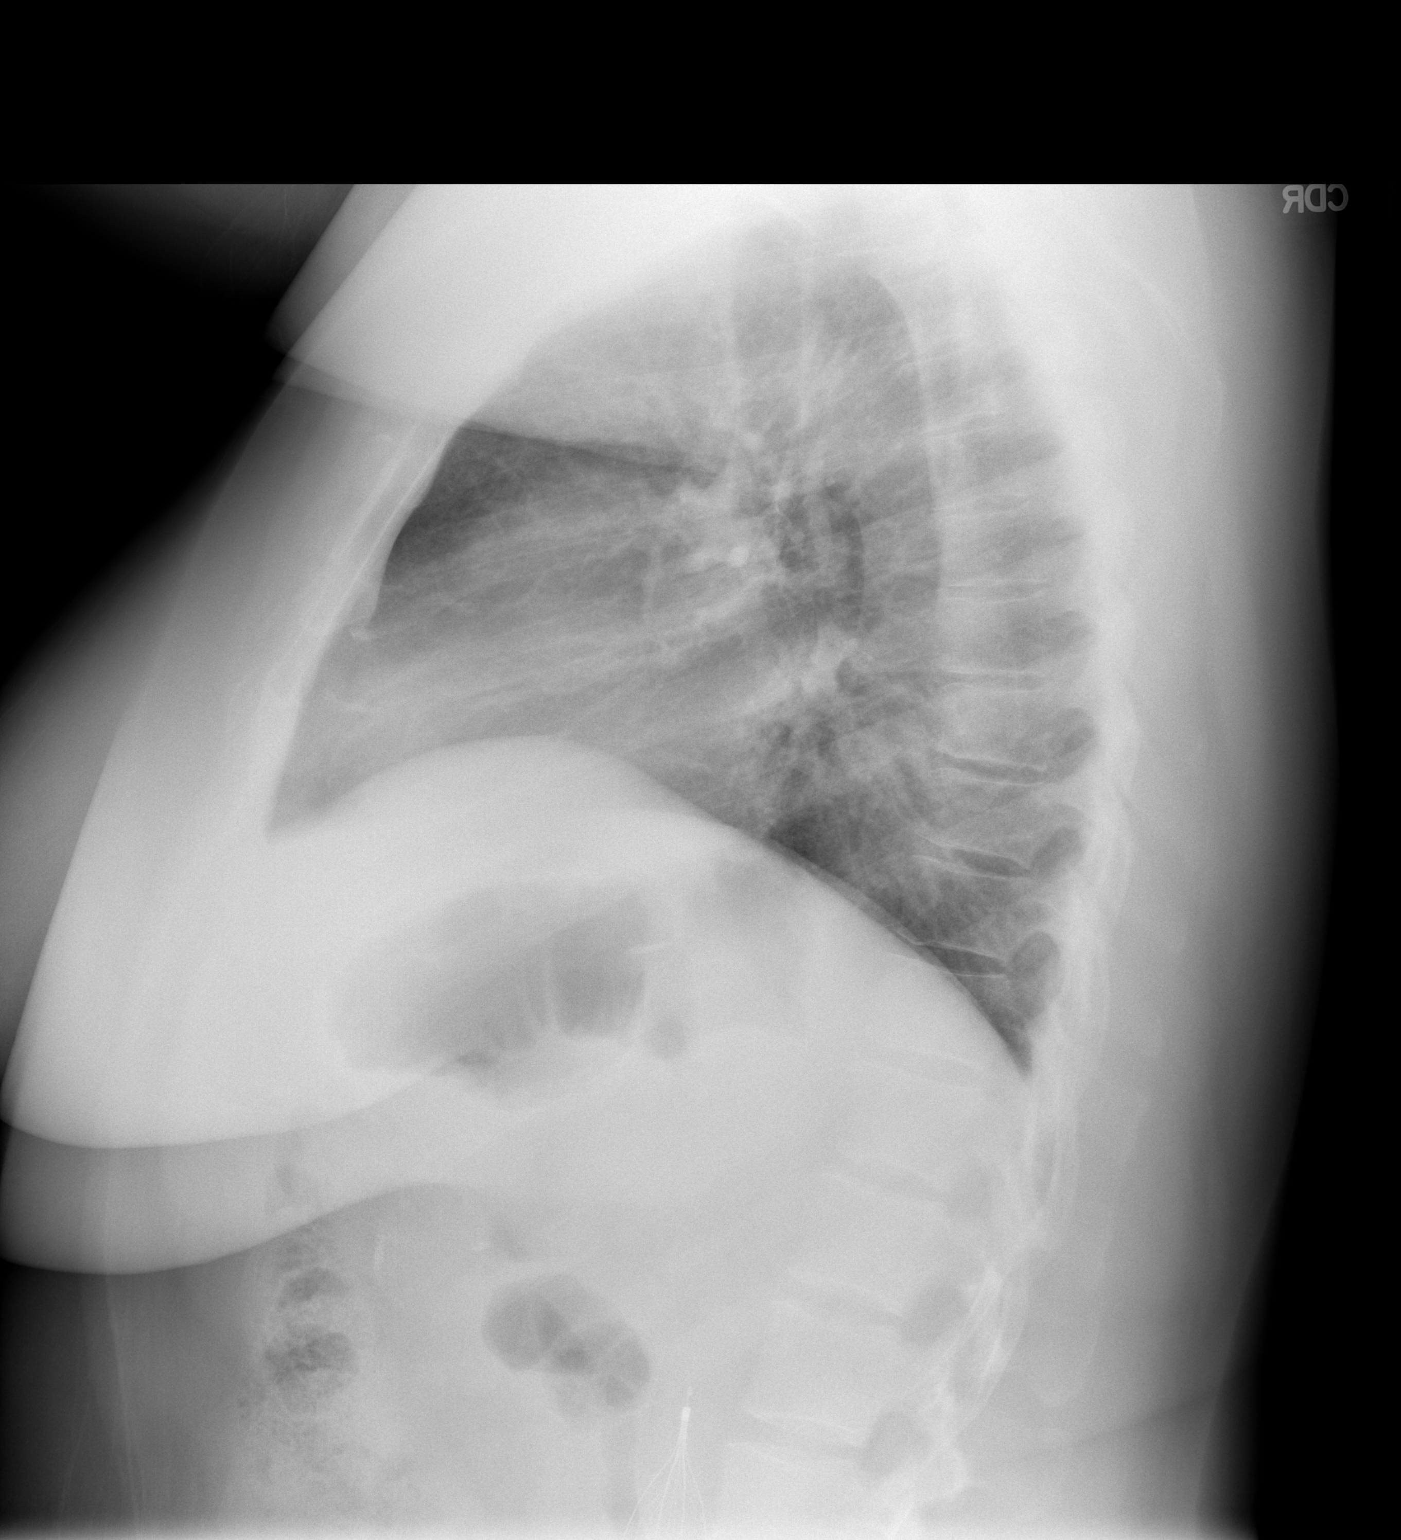

[2 of 2 positions shown; findings below may reference images not displayed]

FINDINGS: Low lung volumes. Bronchial thickening versus bronchovascular
crowding. The cardiomediastinal contours are normal. Pulmonary
vasculature is normal. No consolidation, pleural effusion, or
pneumothorax. No acute osseous abnormalities are seen.
IMPRESSION: Low lung volumes. Bronchial thickening versus bronchovascular
crowding secondary to hypoaeration.

## 2020-02-17 ENCOUNTER — Ambulatory Visit: Payer: Self-pay | Attending: Internal Medicine

## 2020-02-17 DIAGNOSIS — Z23 Encounter for immunization: Secondary | ICD-10-CM

## 2020-02-17 NOTE — Progress Notes (Signed)
   Covid-19 Vaccination Clinic  Name:  Alexandria [name]    MRN: [id number] DOB: 11-Nov-1971  02/17/2020  Alexandria [name] was observed post Covid-19 immunization for 15 minutes without incident. She was provided with Vaccine Information Sheet and instruction to access the V-Safe system.   Alexandria [name] was instructed to call 911 with any severe reactions post vaccine: Marland Kitchen Difficulty breathing  . Swelling of face and throat  . A fast heartbeat  . A bad rash all over body  . Dizziness and weakness   Immunizations Administered    Name Date Dose VIS Date Route   Pfizer COVID-19 Vaccine 02/17/2020 12:22 PM 0.3 mL 11/19/2019 Intramuscular   Manufacturer: [employer], [employer]   Lot: [id number]   NDC: [address]-4

## 2020-03-13 ENCOUNTER — Ambulatory Visit: Payer: Self-pay | Attending: Internal Medicine

## 2020-03-13 DIAGNOSIS — Z23 Encounter for immunization: Secondary | ICD-10-CM

## 2020-03-13 NOTE — Progress Notes (Signed)
   Covid-19 Vaccination Clinic  Name:  Hellena Pridgen    MRN: [id number] DOB: Apr 08, 1971  03/13/2020  Ms. Ledwell Turner was observed post Covid-19 immunization for 15 minutes without incident. She was provided with Vaccine Information Sheet and instruction to access the V-Safe system.   Ms. Carina Chaplin was instructed to call 911 with any severe reactions post vaccine: Marland Kitchen Difficulty breathing  . Swelling of face and throat  . A fast heartbeat  . A bad rash all over body  . Dizziness and weakness   Immunizations Administered    Name Date Dose VIS Date Route   Pfizer COVID-19 Vaccine 03/13/2020 10:16 AM 0.3 mL 11/19/2019 Intramuscular   Manufacturer: [employer], [employer]   Lot: [id number]   NDC: [address]-2

## 2020-11-08 ENCOUNTER — Other Ambulatory Visit: Payer: Self-pay

## 2020-11-08 ENCOUNTER — Emergency Department (HOSPITAL_BASED_OUTPATIENT_CLINIC_OR_DEPARTMENT_OTHER)
Admission: EM | Admit: 2020-11-08 | Discharge: 2020-11-08 | Disposition: A | Discharge status: Home / Self Care | Payer: Federal, State, Local not specified - PPO | Attending: Emergency Medicine | Admitting: Emergency Medicine

## 2020-11-08 ENCOUNTER — Encounter (HOSPITAL_BASED_OUTPATIENT_CLINIC_OR_DEPARTMENT_OTHER): Payer: Self-pay | Admitting: Emergency Medicine

## 2020-11-08 DIAGNOSIS — I10 Essential (primary) hypertension: Secondary | ICD-10-CM | POA: Insufficient documentation

## 2020-11-08 DIAGNOSIS — Z79899 Other long term (current) drug therapy: Secondary | ICD-10-CM | POA: Diagnosis not present

## 2020-11-08 DIAGNOSIS — J069 Acute upper respiratory infection, unspecified: Secondary | ICD-10-CM | POA: Insufficient documentation

## 2020-11-08 DIAGNOSIS — R0981 Nasal congestion: Secondary | ICD-10-CM | POA: Diagnosis present

## 2020-11-08 DIAGNOSIS — J0101 Acute recurrent maxillary sinusitis: Secondary | ICD-10-CM | POA: Diagnosis not present

## 2020-11-08 DIAGNOSIS — H9201 Otalgia, right ear: Secondary | ICD-10-CM | POA: Diagnosis not present

## 2020-11-08 DIAGNOSIS — R5381 Other malaise: Secondary | ICD-10-CM | POA: Diagnosis not present

## 2020-11-08 DIAGNOSIS — J45909 Unspecified asthma, uncomplicated: Secondary | ICD-10-CM | POA: Insufficient documentation

## 2020-11-08 DIAGNOSIS — Z7982 Long term (current) use of aspirin: Secondary | ICD-10-CM | POA: Insufficient documentation

## 2020-11-08 LAB — GROUP A STREP BY PCR: Group A Strep by PCR: NOT DETECTED

## 2020-11-08 MED ORDER — ACETAMINOPHEN 500 MG PO TABS
1000.0000 mg | ORAL_TABLET | Freq: Once | ORAL | Status: AC
  Administered 2020-11-08: 1000 mg via ORAL
  Filled 2020-11-08: qty 2

## 2020-11-08 MED ORDER — DOXYCYCLINE HYCLATE 100 MG PO TABS
100.0000 mg | ORAL_TABLET | Freq: Once | ORAL | Status: AC
  Administered 2020-11-08: 100 mg via ORAL
  Filled 2020-11-08: qty 1

## 2020-11-08 MED ORDER — DOXYCYCLINE HYCLATE 100 MG PO CAPS: 100.0000 mg | ORAL_CAPSULE | Freq: Two times a day (BID) | ORAL | 0 refills | Status: AC
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
## 2020-11-08 NOTE — ED Triage Notes (Signed)
Pt reports ear pain, fever, sore throat since yesterday; pt reports grandson has similar symptoms; grandson tested negative for COVID, pt did home test that was negative; pt reports being vaccinated

## 2020-11-08 NOTE — ED Provider Notes (Signed)
[facility] Provider Note: Alexandria Dell, MD, FACEP  CSN: [id number] MRN: [id number] ARRIVAL: 11/08/20 at 0028 ROOM: [facility]   CHIEF COMPLAINT  URI   HISTORY OF PRESENT ILLNESS  11/08/20 4:01 AM Alexandria [name] is a 49 y.o. female who has had URI symptoms for the past 3 days.  Specifically she has had fever, nasal congestion, right-sided maxillary sinus pressure, and right-sided ear pain which she rates as a 7 out of 10.  She is also had a cough and general malaise.  She had a negative home Covid test and has had the Covid vaccine.  She is primarily concerned that she may be developing a sinus infection.  She has a history of Mnire's disease and if she does not get her sinus infection treated it will often lead to vertigo.   Past Medical History:  Diagnosis Date  . Arthritis   . Asthma   . Blood transfusion 2011   with right tka  . Fibromyalgia   . GERD (gastroesophageal reflux disease)   . H/O blood clots 2011-2012   post op clots calf right leg..   . Hypertension     Past Surgical History:  Procedure Laterality Date  . ABDOMINAL HYSTERECTOMY    . GASTRIC BYPASS  2012   sleeve bypass  . JOINT REPLACEMENT  2011   right knee  . TONSILLECTOMY    . TOTAL KNEE ARTHROPLASTY  04/07/2012   Procedure: TOTAL KNEE ARTHROPLASTY;  Surgeon: Valeria Batman, MD;  Location: [facility] OR;  Service: Orthopedics;  Laterality: Left;  . TUBAL LIGATION      Family History  Problem Relation Age of Onset  . Anesthesia problems Neg Hx     Social History   Tobacco Use  . Smoking status: Never Smoker  . Smokeless tobacco: Never Used  Substance Use Topics  . Alcohol use: No  . Drug use: No    Prior to Admission medications   Medication Sig Start Date End Date Taking? Authorizing Provider  amLODipine (NORVASC) 10 MG tablet Take 10 mg by mouth daily.    [provider]  aspirin 81 MG tablet Take 81 mg by mouth daily.    [provider]    benzonatate (TESSALON) 100 MG capsule Take 1 capsule (100 mg total) by mouth every 8 (eight) hours. 01/12/17   Melene Plan, DO  Cholecalciferol (VITAMIN D PO) Take 1 tablet by mouth daily.    [provider]  diclofenac (VOLTAREN) 75 MG EC tablet Take 75 mg by mouth 2 (two) times daily.    [provider]  doxycycline (VIBRAMYCIN) 100 MG capsule Take 1 capsule (100 mg total) by mouth 2 (two) times daily. One po bid x 7 days 11/08/20   Alexandria Milles, MD  DULoxetine (CYMBALTA) 60 MG capsule Take 60 mg by mouth daily.    [provider]  esomeprazole (NEXIUM) 40 MG capsule Take 40 mg by mouth daily before breakfast.    [provider]  estradiol (CLIMARA - DOSED IN MG/24 HR) 0.025 mg/24hr Place 1 patch onto the skin once a week.    [provider]  hydrochlorothiazide (HYDRODIURIL) 25 MG tablet Take 25 mg by mouth daily.    [provider]  magnesium chloride (SLOW-MAG) 64 MG TBEC SR tablet Take by mouth.    [provider]  Multiple Vitamin (MULITIVITAMIN WITH MINERALS) TABS Take 1 tablet by mouth daily.    [provider]  Naltrexone-Bupropion HCl (CONTRAVE PO) Take by  mouth.    [provider]  ondansetron (ZOFRAN ODT) 4 MG disintegrating tablet 4mg  ODT q4 hours prn nausea/vomit 01/12/17   03/12/17, DO  traMADol (ULTRAM) 50 MG tablet Take 50 mg by mouth every 6 (six) hours as needed. For pain    [provider]  triamterene-hydrochlorothiazide (MAXZIDE-25) 37.5-25 MG tablet Take 1 tablet by mouth daily. 11/23/16   11/25/16, PA-C  Vitamin D, Ergocalciferol, (DRISDOL) 50000 UNITS CAPS capsule Take 50,000 Units by mouth every 7 (seven) days.    [provider]    Allergies Morphine and related   REVIEW OF SYSTEMS  Negative except as noted here or in the History of Present Illness.   PHYSICAL EXAMINATION  Initial Vital Signs Blood pressure (!) 157/94, pulse 76, temperature 99.3 F (37.4 C),  temperature source Oral, resp. rate 17, height 5\' 6"  (1.676 m), weight 63 kg, SpO2 99 %.  Examination General: Well-developed, well-nourished female in no acute distress; appearance consistent with age of record HENT: normocephalic; atraumatic; TMs normal; pharynx normal; tenderness to percussion of right maxillary sinus Eyes: pupils equal, round and reactive to light; extraocular muscles intact Neck: supple Heart: regular rate and rhythm Lungs: clear to auscultation bilaterally Abdomen: soft; nondistended; nontender; bowel sounds present Extremities: No deformity; full range of motion Neurologic: Awake, alert and oriented; motor function intact in all extremities and symmetric; no facial droop Skin: Warm and dry Psychiatric: Normal mood and affect   RESULTS  Summary of this visit's results, reviewed and interpreted by myself:   EKG Interpretation  Date/Time:    Ventricular Rate:    PR Interval:    QRS Duration:   QT Interval:    QTC Calculation:   R Axis:     Text Interpretation:        Laboratory Studies: Results for orders placed or performed during the hospital encounter of 11/08/20 (from the past 24 hour(s))  Group A Strep by PCR     Status: None   Collection Time: 11/08/20 12:50 AM   Specimen: Throat; Sterile Swab  Result Value Ref Range   Group A Strep by PCR NOT DETECTED NOT DETECTED   Imaging Studies: No results found.  ED COURSE and MDM  Nursing notes, initial and subsequent vitals signs, including pulse oximetry, reviewed and interpreted by myself.  Vitals:   11/08/20 0043 11/08/20 0044  BP: (!) 157/94   Pulse: 76   Resp: 17   Temp: 99.3 F (37.4 C)   TempSrc: Oral   SpO2: 99%   Weight:  63 kg  Height:  5\' 6"  (1.676 m)   Medications  doxycycline (VIBRA-TABS) tablet 100 mg (has no administration in time range)  acetaminophen (TYLENOL) tablet 1,000 mg (1,000 mg Oral Given 11/08/20 0322)   Focal ahead and treat for sinusitis with doxycycline.  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   PROCEDURES  Procedures   ED DIAGNOSES     ICD-10-CM   1. Acute recurrent maxillary sinusitis  J01.01   2. Viral URI with cough  J06.9        Genine Beckett, 14/01/21, MD 11/08/20 [phone]

## 2022-05-01 ENCOUNTER — Other Ambulatory Visit: Payer: Self-pay

## 2022-05-01 ENCOUNTER — Emergency Department (HOSPITAL_BASED_OUTPATIENT_CLINIC_OR_DEPARTMENT_OTHER): Payer: Federal, State, Local not specified - PPO

## 2022-05-01 ENCOUNTER — Emergency Department (HOSPITAL_BASED_OUTPATIENT_CLINIC_OR_DEPARTMENT_OTHER)
Admission: EM | Admit: 2022-05-01 | Discharge: 2022-05-01 | Disposition: A | Discharge status: Home / Self Care | Payer: Federal, State, Local not specified - PPO | Attending: Emergency Medicine | Admitting: Emergency Medicine

## 2022-05-01 ENCOUNTER — Encounter (HOSPITAL_BASED_OUTPATIENT_CLINIC_OR_DEPARTMENT_OTHER): Payer: Self-pay | Admitting: Emergency Medicine

## 2022-05-01 DIAGNOSIS — I10 Essential (primary) hypertension: Secondary | ICD-10-CM | POA: Diagnosis not present

## 2022-05-01 DIAGNOSIS — Z8249 Family history of ischemic heart disease and other diseases of the circulatory system: Secondary | ICD-10-CM | POA: Diagnosis not present

## 2022-05-01 DIAGNOSIS — Z7982 Long term (current) use of aspirin: Secondary | ICD-10-CM | POA: Diagnosis not present

## 2022-05-01 DIAGNOSIS — R0789 Other chest pain: Secondary | ICD-10-CM

## 2022-05-01 DIAGNOSIS — R072 Precordial pain: Secondary | ICD-10-CM | POA: Insufficient documentation

## 2022-05-01 LAB — COMPREHENSIVE METABOLIC PANEL
ALT: 39 U/L (ref 0–44)
AST: 27 U/L (ref 15–41)
Albumin: 3.6 g/dL (ref 3.5–5.0)
Alkaline Phosphatase: 95 U/L (ref 38–126)
Anion gap: 6 (ref 5–15)
BUN: 11 mg/dL (ref 6–20)
CO2: 23 mmol/L (ref 22–32)
Calcium: 8.6 mg/dL — ABNORMAL LOW (ref 8.9–10.3)
Chloride: 111 mmol/L (ref 98–111)
Creatinine, Ser: 0.7 mg/dL (ref 0.44–1.00)
GFR, Estimated: >60 mL/min (ref >60)
Glucose, Bld: 95 mg/dL (ref 70–99)
Potassium: 3.5 mmol/L (ref 3.5–5.1)
Sodium: 140 mmol/L (ref 135–145)
Total Bilirubin: 0.3 mg/dL (ref 0.3–1.2)
Total Protein: 6.2 g/dL — ABNORMAL LOW (ref 6.5–8.1)

## 2022-05-01 LAB — CBC WITH DIFFERENTIAL/PLATELET
Abs Immature Granulocytes: 0.01 10*3/uL (ref 0.00–0.07)
Basophils Absolute: 0 10*3/uL (ref 0.0–0.1)
Basophils Relative: 1 %
Eosinophils Absolute: 0.1 10*3/uL (ref 0.0–0.5)
Eosinophils Relative: 2 %
HCT: 31.8 % — ABNORMAL LOW (ref 36.0–46.0)
Hemoglobin: 10.2 g/dL — ABNORMAL LOW (ref 12.0–15.0)
Immature Granulocytes: 0 %
Lymphocytes Relative: 46 %
Lymphs Abs: 1.3 10*3/uL (ref 0.7–4.0)
MCH: 26.8 pg (ref 26.0–34.0)
MCHC: 32.1 g/dL (ref 30.0–36.0)
MCV: 83.5 fL (ref 80.0–100.0)
Monocytes Absolute: 0.2 10*3/uL (ref 0.1–1.0)
Monocytes Relative: 7 %
Neutro Abs: 1.3 10*3/uL — ABNORMAL LOW (ref 1.7–7.7)
Neutrophils Relative %: 44 %
Platelets: 270 10*3/uL (ref 150–400)
RBC: 3.81 MIL/uL — ABNORMAL LOW (ref 3.87–5.11)
RDW: 12.9 % (ref 11.5–15.5)
WBC: 2.8 10*3/uL — ABNORMAL LOW (ref 4.0–10.5)
nRBC: 0 % (ref 0.0–0.2)

## 2022-05-01 LAB — TROPONIN I (HIGH SENSITIVITY): Troponin I (High Sensitivity): <2 ng/L (ref <18)

## 2022-05-01 NOTE — Discharge Instructions (Addendum)
Your chest pain is likely from reflux  You need to continue taking your Nexium.  Consider taking Pepcid 20 mg twice daily as needed.  Follow-up with your GI surgeon  If you have persistent chest pain, I recommend follow-up with cardiology for stress test  Return to ER if you have worse chest pain, trouble breathing, abdominal pain

## 2022-05-01 NOTE — ED Triage Notes (Signed)
Pt reports pulling sensation to LT side chest x 2d; also reports RLE pain that is not new

## 2022-05-01 NOTE — ED Provider Notes (Signed)
[facility] EMERGENCY DEPARTMENT Provider Note   CSN: [id number] Arrival date & time: 05/01/22  1551     History  Chief Complaint  Patient presents with   Chest Pain    Alexandria [name] is a 51 y.o. female history of hypertension, here presenting with chest pain.  Patient states that she has been having chest pain for the last several days.  She states that she works at the [employer] but denies any recent heavy lifting.  Patient states that the pain is substernal and she is concerned that maybe this is anxiety.  Patient denies any exertional chest pain.  She also thought it was from her reflux and took omeprazole with minimal relief.  She does have family history of CAD so she wants to be checked out.  She does not have any personal history of CAD or stents or recent travel.  The history is provided by the patient.      Home Medications Prior to Admission medications   Medication Sig Start Date End Date Taking? Authorizing Provider  amLODipine (NORVASC) 10 MG tablet Take 10 mg by mouth daily.    [provider]  aspirin 81 MG tablet Take 81 mg by mouth daily.    [provider]  benzonatate (TESSALON) 100 MG capsule Take 1 capsule (100 mg total) by mouth every 8 (eight) hours. 01/12/17   Melene Plan, DO  Cholecalciferol (VITAMIN D PO) Take 1 tablet by mouth daily.    [provider]  diclofenac (VOLTAREN) 75 MG EC tablet Take 75 mg by mouth 2 (two) times daily.    [provider]  doxycycline (VIBRAMYCIN) 100 MG capsule Take 1 capsule (100 mg total) by mouth 2 (two) times daily. One po bid x 7 days 11/08/20   Molpus, John, MD  DULoxetine (CYMBALTA) 60 MG capsule Take 60 mg by mouth daily.    [provider]  esomeprazole (NEXIUM) 40 MG capsule Take 40 mg by mouth daily before breakfast.    [provider]  estradiol (CLIMARA - DOSED IN MG/24 HR) 0.025 mg/24hr Place 1 patch onto the skin once a week.    [provider]  hydrochlorothiazide (HYDRODIURIL) 25 MG tablet Take 25 mg by mouth daily.    [provider]  magnesium chloride (SLOW-MAG) 64 MG TBEC SR tablet Take by mouth.    [provider]  Multiple Vitamin (MULITIVITAMIN WITH MINERALS) TABS Take 1 tablet by mouth daily.    [provider]  Naltrexone-Bupropion HCl (CONTRAVE PO) Take by mouth.    [provider]  ondansetron (ZOFRAN ODT) 4 MG disintegrating tablet 4mg  ODT q4 hours prn nausea/vomit 01/12/17   03/12/17, DO  traMADol (ULTRAM) 50 MG tablet Take 50 mg by mouth every 6 (six) hours as needed. For pain    [provider]  triamterene-hydrochlorothiazide (MAXZIDE-25) 37.5-25 MG tablet Take 1 tablet by mouth daily. 11/23/16   11/25/16, PA-C  Vitamin D, Ergocalciferol, (DRISDOL) 50000 UNITS CAPS capsule Take 50,000 Units by mouth every 7 (seven) days.    [provider]      Allergies    Morphine and related    Review of Systems   Review of Systems  Cardiovascular:  Positive for chest pain.  All other systems reviewed and are negative.  Physical Exam Updated Vital Signs BP 139/89   Pulse (!) 56   Temp 98.3 F (36.8 C) (Oral)   Resp 20   Ht 5\' 6"  (  1.676 m)   Wt 68.9 kg   SpO2 100%   BMI 24.53 kg/m  Physical Exam Vitals and nursing note reviewed.  Constitutional:      Comments: Anxious  HENT:     Head: Normocephalic.  Eyes:     Extraocular Movements: Extraocular movements intact.     Pupils: Pupils are equal, round, and reactive to light.  Cardiovascular:     Rate and Rhythm: Normal rate and regular rhythm.     Heart sounds: Normal heart sounds.  Pulmonary:     Effort: Pulmonary effort is normal.     Breath sounds: Normal breath sounds.     Comments: No obvious reproducible tenderness Abdominal:     General: Bowel sounds are normal.     Palpations: Abdomen is soft.  Musculoskeletal:        General: Normal range of motion.     Cervical back:  Normal range of motion and neck supple.  Skin:    General: Skin is warm.     Capillary Refill: Capillary refill takes less than 2 seconds.  Neurological:     General: No focal deficit present.     Mental Status: She is alert and oriented to person, place, and time.  Psychiatric:        Mood and Affect: Mood normal.        Behavior: Behavior normal.    ED Results / Procedures / Treatments   Labs (all labs ordered are listed, but only abnormal results are displayed) Labs Reviewed  CBC WITH DIFFERENTIAL/PLATELET - Abnormal; Notable for the following components:      Result Value   WBC 2.8 (*)    RBC 3.81 (*)    Hemoglobin 10.2 (*)    HCT 31.8 (*)    Neutro Abs 1.3 (*)    All other components within normal limits  COMPREHENSIVE METABOLIC PANEL - Abnormal; Notable for the following components:   Calcium 8.6 (*)    Total Protein 6.2 (*)    All other components within normal limits  TROPONIN I (HIGH SENSITIVITY)    EKG EKG Interpretation  Date/Time:  Wednesday May 01 2022 16:11:05 EDT Ventricular Rate:  58 PR Interval:  186 QRS Duration: 98 QT Interval:  422 QTC Calculation: 415 R Axis:   46 Text Interpretation: Sinus rhythm No significant change since last tracing Confirmed by Richardean CanalYao, Mahati Vajda H [phone]([id number]) on 05/01/2022 4:14:41 PM  Radiology DG Chest 2 View  Result Date: 05/01/2022 CLINICAL DATA:  Chest pain. EXAM: CHEST - 2 VIEW COMPARISON:  January 12, 2017. FINDINGS: The heart size and mediastinal contours are within normal limits. Both lungs are clear. The visualized skeletal structures are unremarkable. IMPRESSION: No active cardiopulmonary disease. Electronically Signed   By: Lupita RaiderJames  Green Jr M.D.   On: 05/01/2022 16:59    Procedures Procedures    Medications Ordered in ED Medications - No data to display  ED Course/ Medical Decision Making/ A&P                           Medical Decision Making Alexandria [name] is a 51 y.o. female here presenting with chest  pain.  Patient has chest pain for the last 2 days.  Its associated with some anxiety.  I have low suspicion for ACS or PE.  Plan to get CBC and CMP and troponin x1 and chest x-ray.  5:21 PM I reviewed patient's labs and independently reviewed her imaging.  Patient's chest  x-ray is clear.  Patient's white blood cell count is 2.8 and hemoglobin is 10.2 which is lower than previous.  Patient knew about this and is being referred to a hematologist already.  Patient's troponin is negative.  I think patient likely has some reflux given she has a history of gastric bypass.  She is already on omeprazole.  I told her to take Pepcid as needed and follow-up with her GI surgeon.  She can also follow-up with cardiology for stress test as well.  At this point, patient is stable for discharge  Problems Addressed: Other chest pain: acute illness or injury  Amount and/or Complexity of Data Reviewed Labs: ordered. Decision-making details documented in ED Course. Radiology: ordered and independent interpretation performed. Decision-making details documented in ED Course. ECG/medicine tests: ordered and independent interpretation performed. Decision-making details documented in ED Course.    Final Clinical Impression(s) / ED Diagnoses Final diagnoses:  None    Rx / DC Orders ED Discharge Orders     None         Charlynne Pander, MD 05/01/22 1722

## 2022-06-12 ENCOUNTER — Encounter: Payer: Self-pay | Admitting: *Deleted

## 2022-06-29 ENCOUNTER — Encounter (HOSPITAL_BASED_OUTPATIENT_CLINIC_OR_DEPARTMENT_OTHER): Payer: Self-pay | Admitting: Emergency Medicine

## 2022-06-29 ENCOUNTER — Emergency Department (HOSPITAL_BASED_OUTPATIENT_CLINIC_OR_DEPARTMENT_OTHER)
Admission: EM | Admit: 2022-06-29 | Discharge: 2022-06-29 | Disposition: A | Discharge status: Home / Self Care | Payer: Federal, State, Local not specified - PPO | Attending: Emergency Medicine | Admitting: Emergency Medicine

## 2022-06-29 ENCOUNTER — Other Ambulatory Visit: Payer: Self-pay

## 2022-06-29 DIAGNOSIS — U071 COVID-19: Secondary | ICD-10-CM | POA: Insufficient documentation

## 2022-06-29 DIAGNOSIS — Z7982 Long term (current) use of aspirin: Secondary | ICD-10-CM | POA: Diagnosis not present

## 2022-06-29 DIAGNOSIS — J029 Acute pharyngitis, unspecified: Secondary | ICD-10-CM | POA: Diagnosis present

## 2022-06-29 DIAGNOSIS — M25551 Pain in right hip: Secondary | ICD-10-CM | POA: Insufficient documentation

## 2022-06-29 DIAGNOSIS — Z96653 Presence of artificial knee joint, bilateral: Secondary | ICD-10-CM | POA: Insufficient documentation

## 2022-06-29 HISTORY — DX: Herpesviral infection of urogenital system, unspecified: A60.00

## 2022-06-29 HISTORY — DX: Neutropenia, unspecified: D70.9

## 2022-06-29 LAB — GROUP A STREP BY PCR: Group A Strep by PCR: NOT DETECTED

## 2022-06-29 LAB — SARS CORONAVIRUS 2 BY RT PCR: SARS Coronavirus 2 by RT PCR: POSITIVE — AB

## 2022-06-29 MED ORDER — KETOROLAC TROMETHAMINE 15 MG/ML IJ SOLN
30.0000 mg | Freq: Once | INTRAMUSCULAR | Status: AC
  Administered 2022-06-29: 30 mg via INTRAMUSCULAR
  Filled 2022-06-29: qty 2

## 2022-06-29 MED ORDER — PREDNISONE 20 MG PO TABS: 40.0000 mg | ORAL_TABLET | Freq: Every day | ORAL | 0 refills | Status: AC

## 2022-06-29 NOTE — ED Triage Notes (Addendum)
Pt reports R hip pain. Hx of tendonitis and arthritis in this hip and back. No known injury, but is on feet for work. Home medications not working.  Also reports bilateral sinus pain and sore throat since last night.

## 2022-06-29 NOTE — Discharge Instructions (Addendum)
You are seen the emergency department for your right hip pain after you are unable to be seen in urgent care for a joint injection.  We were able to give you a Toradol shot, a prescription for prednisone 40 mg daily, and recommended that you follow-up with your pain management clinic for further management of your pain.  You also tested positive for COVID and along with the attached information recommend that you quarantine for 5 days and monitor for worsening symptoms.  If you have any new or worsening pain or weakness please return to the emergency department.

## 2022-06-29 NOTE — ED Provider Notes (Signed)
[facility] EMERGENCY DEPARTMENT Provider Note   CSN: [id number] Arrival date & time: 06/29/22  1607     History  Chief Complaint  Patient presents with   Hip Pain   Facial Pain   Sore Throat    Alexandria [name] is a 51 y.o. female.  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  She has chronic right hip pain which she has undergone radioablation and intra-articular injections as well as followed with pain management to address.  She reports that she gets steroid injections into her hip about every 6 months at urgent care but today all the urgent cares were closed that she went to.  She denies any acute worsening of the pain but like a joint injection at this time.  She also reports 1 day of sore throat and congestion in her frontal and maxillary sinuses.  She states this is like her regular seasonal allergies but is worse.  She denies any fevers, cough, rhinorrhea, shortness of breath, chest pain, nausea, vomiting.   Hip Pain Pertinent negatives include no chest pain and no shortness of breath.  Sore Throat Pertinent negatives include no chest pain and no shortness of breath.       Home Medications Prior to Admission medications   Medication Sig Start Date End Date Taking? Authorizing Provider  predniSONE (DELTASONE) 20 MG tablet Take 2 tablets (40 mg total) by mouth daily for 5 days. 06/29/22 07/04/22 Yes Rocky Morel, DO  amLODipine (NORVASC) 10 MG tablet Take 10 mg by mouth daily.    [provider]  aspirin 81 MG tablet Take 81 mg by mouth daily.    [provider]  benzonatate (TESSALON) 100 MG capsule Take 1 capsule (100 mg total) by mouth every 8 (eight) hours. 01/12/17   Melene Plan, DO  Cholecalciferol (VITAMIN D PO) Take 1 tablet by mouth daily.    [provider]   diclofenac (VOLTAREN) 75 MG EC tablet Take 75 mg by mouth 2 (two) times daily.    [provider]  doxycycline (VIBRAMYCIN) 100 MG capsule Take 1 capsule (100 mg total) by mouth 2 (two) times daily. One po bid x 7 days 11/08/20   Molpus, John, MD  DULoxetine (CYMBALTA) 60 MG capsule Take 60 mg by mouth daily.    [provider]  esomeprazole (NEXIUM) 40 MG capsule Take 40 mg by mouth daily before breakfast.    [provider]  estradiol (CLIMARA - DOSED IN MG/24 HR) 0.025 mg/24hr Place 1 patch onto the skin once a week.    [provider]  hydrochlorothiazide (HYDRODIURIL) 25 MG tablet Take 25 mg by mouth daily.    [provider]  magnesium chloride (SLOW-MAG) 64 MG TBEC SR tablet Take by mouth.    [provider]  Multiple Vitamin (MULITIVITAMIN WITH MINERALS) TABS Take 1 tablet by mouth daily.    [provider]  Naltrexone-Bupropion HCl (CONTRAVE PO) Take by mouth.    [provider]  ondansetron (ZOFRAN ODT) 4 MG disintegrating tablet 4mg  ODT q4 hours prn nausea/vomit 01/12/17   03/12/17, DO  traMADol (ULTRAM) 50 MG tablet Take 50 mg by mouth every 6 (six) hours as needed. For pain    [provider]  triamterene-hydrochlorothiazide (MAXZIDE-25) 37.5-25 MG tablet Take 1 tablet by mouth daily. 11/23/16   11/25/16,  Rosezella Florida, PA-C  Vitamin D, Ergocalciferol, (DRISDOL) 50000 UNITS CAPS capsule Take 50,000 Units by mouth every 7 (seven) days.    [provider]      Allergies    Morphine and related    Review of Systems   Review of Systems  Constitutional:  Negative for chills and fever.  HENT:  Positive for sore throat.   Respiratory:  Negative for shortness of breath.   Cardiovascular:  Negative for chest pain.  Gastrointestinal:  Negative for nausea.  Musculoskeletal:  Positive for arthralgias and back pain.  Neurological:  Negative for weakness.    Physical Exam Updated Vital Signs BP 136/81 (BP  Location: Left Arm)   Pulse 69   Temp 99.1 F (37.3 C) (Oral)   Resp 17   Ht 5' 0.6" (1.539 m)   Wt 70.3 kg   SpO2 100%   BMI 29.67 kg/m  Physical Exam Vitals reviewed.  Constitutional:      General: She is not in acute distress. HENT:     Mouth/Throat:     Mouth: Mucous membranes are moist.     Pharynx: Oropharynx is clear.     Tonsils: No tonsillar exudate.  Eyes:     Conjunctiva/sclera: Conjunctivae normal.  Cardiovascular:     Rate and Rhythm: Normal rate and regular rhythm.  Pulmonary:     Effort: Pulmonary effort is normal.     Breath sounds: Normal breath sounds.  Abdominal:     Palpations: Abdomen is soft.     Tenderness: There is no abdominal tenderness.  Musculoskeletal:     Right hip: Tenderness present. Normal range of motion.     Right lower leg: No edema.     Left lower leg: No edema.     Comments: Tenderness to palpation over the right greater trochanter and right AIIS.  Right leg is 1 to 2 cm longer than the left.  Skin:    Capillary Refill: Capillary refill takes less than 2 seconds.  Neurological:     Mental Status: She is alert.     ED Results / Procedures / Treatments   Labs (all labs ordered are listed, but only abnormal results are displayed) Labs Reviewed  SARS CORONAVIRUS 2 BY RT PCR - Abnormal; Notable for the following components:      Result Value   SARS Coronavirus 2 by RT PCR POSITIVE (*)    All other components within normal limits  GROUP A STREP BY PCR    EKG None  Radiology No results found.  Procedures Procedures    Medications Ordered in ED Medications  ketorolac (TORADOL) 15 MG/ML injection 30 mg (30 mg Intramuscular Given 06/29/22 1825)    ED Course/ Medical Decision Making/ A&P                           Medical Decision Making [name] is a 51 year old female with a past medical history of osteoarthritis and along right leg who presents with chronic right hip pain wanting a steroid joint injection.  She  follows with pain management who coordinator prior injections.  There was no acute worsening of her pain or level of function in her right leg.  We offered her a Toradol shot with a short course of oral prednisone and recommended that she follow-up with her pain management clinic for further management.  She was agreeable to this plan and was discharged in stable condition with all questions answered prior  to discharge.  She was found to be COVID-positive but apart from her increased sinus congestion and sore throat today she had no worsening symptoms that would need further treatment at this time.  She was counseled on symptoms to watch for that would indicate the need for further evaluation and management by healthcare professional.  Problems Addressed: COVID-19: acute illness or injury Right hip pain: acute illness or injury  Amount and/or Complexity of Data Reviewed External Data Reviewed: notes. Labs: ordered. Decision-making details documented in ED Course.  Risk Prescription drug management.           Final Clinical Impression(s) / ED Diagnoses Final diagnoses:  Right hip pain  COVID-19    Rx / DC Orders ED Discharge Orders          Ordered    predniSONE (DELTASONE) 20 MG tablet  Daily        06/29/22 1847              Rocky Morel, DO 06/29/22 1850    Milagros Loll, MD 06/29/22 2038
